# Patient Record
Sex: Female | Born: 1955 | Race: White | Hispanic: No | Marital: Married | Smoking: Never smoker
Health system: Southern US, Community
[De-identification: ages and names within clinical notes are randomized; demographics above are authoritative.]

## PROBLEM LIST (undated history)

## (undated) DIAGNOSIS — J302 Other seasonal allergic rhinitis: Secondary | ICD-10-CM

## (undated) DIAGNOSIS — T8859XA Other complications of anesthesia, initial encounter: Secondary | ICD-10-CM

## (undated) DIAGNOSIS — C801 Malignant (primary) neoplasm, unspecified: Secondary | ICD-10-CM

## (undated) DIAGNOSIS — T4145XA Adverse effect of unspecified anesthetic, initial encounter: Secondary | ICD-10-CM

## (undated) DIAGNOSIS — K219 Gastro-esophageal reflux disease without esophagitis: Secondary | ICD-10-CM

## (undated) DIAGNOSIS — R112 Nausea with vomiting, unspecified: Secondary | ICD-10-CM

## (undated) DIAGNOSIS — E041 Nontoxic single thyroid nodule: Secondary | ICD-10-CM

## (undated) DIAGNOSIS — R011 Cardiac murmur, unspecified: Secondary | ICD-10-CM

## (undated) DIAGNOSIS — Z9889 Other specified postprocedural states: Secondary | ICD-10-CM

## (undated) DIAGNOSIS — N6310 Unspecified lump in the right breast, unspecified quadrant: Secondary | ICD-10-CM

## (undated) DIAGNOSIS — I498 Other specified cardiac arrhythmias: Secondary | ICD-10-CM

## (undated) DIAGNOSIS — Z8614 Personal history of Methicillin resistant Staphylococcus aureus infection: Secondary | ICD-10-CM

## (undated) DIAGNOSIS — M1990 Unspecified osteoarthritis, unspecified site: Secondary | ICD-10-CM

## (undated) HISTORY — DX: Unspecified lump in the right breast, unspecified quadrant: N63.10

## (undated) HISTORY — PX: TONSILLECTOMY: SUR1361

## (undated) HISTORY — PX: EYE SURGERY: SHX253

## (undated) HISTORY — DX: Other seasonal allergic rhinitis: J30.2

---

## 2001-01-31 HISTORY — PX: BREAST CYST ASPIRATION: SHX578

## 2004-04-20 ENCOUNTER — Ambulatory Visit: Payer: Self-pay | Admitting: General Practice

## 2008-05-26 ENCOUNTER — Ambulatory Visit: Payer: Self-pay | Admitting: Internal Medicine

## 2012-09-24 ENCOUNTER — Ambulatory Visit: Payer: Self-pay | Admitting: Family Medicine

## 2013-01-31 HISTORY — PX: COLONOSCOPY: SHX174

## 2013-03-21 ENCOUNTER — Ambulatory Visit: Payer: Self-pay | Admitting: Family Medicine

## 2013-05-27 ENCOUNTER — Ambulatory Visit: Payer: Self-pay | Admitting: Gastroenterology

## 2013-05-28 LAB — PATHOLOGY REPORT

## 2015-06-04 ENCOUNTER — Emergency Department: Payer: BLUE CROSS/BLUE SHIELD

## 2015-06-04 ENCOUNTER — Encounter: Payer: Self-pay | Admitting: Emergency Medicine

## 2015-06-04 ENCOUNTER — Emergency Department
Admission: EM | Admit: 2015-06-04 | Discharge: 2015-06-04 | Disposition: A | Discharge status: Home / Self Care | Payer: BLUE CROSS/BLUE SHIELD | Attending: Emergency Medicine | Admitting: Emergency Medicine

## 2015-06-04 DIAGNOSIS — S93502A Unspecified sprain of left great toe, initial encounter: Secondary | ICD-10-CM | POA: Diagnosis not present

## 2015-06-04 DIAGNOSIS — Y92410 Unspecified street and highway as the place of occurrence of the external cause: Secondary | ICD-10-CM | POA: Diagnosis not present

## 2015-06-04 DIAGNOSIS — S5011XA Contusion of right forearm, initial encounter: Secondary | ICD-10-CM | POA: Insufficient documentation

## 2015-06-04 DIAGNOSIS — Y999 Unspecified external cause status: Secondary | ICD-10-CM | POA: Diagnosis not present

## 2015-06-04 DIAGNOSIS — S20212A Contusion of left front wall of thorax, initial encounter: Secondary | ICD-10-CM | POA: Insufficient documentation

## 2015-06-04 DIAGNOSIS — S93509A Unspecified sprain of unspecified toe(s), initial encounter: Secondary | ICD-10-CM

## 2015-06-04 DIAGNOSIS — S20219A Contusion of unspecified front wall of thorax, initial encounter: Secondary | ICD-10-CM

## 2015-06-04 DIAGNOSIS — Y939 Activity, unspecified: Secondary | ICD-10-CM | POA: Diagnosis not present

## 2015-06-04 DIAGNOSIS — S299XXA Unspecified injury of thorax, initial encounter: Secondary | ICD-10-CM | POA: Diagnosis present

## 2015-06-04 LAB — COMPREHENSIVE METABOLIC PANEL
ALK PHOS: 49 U/L (ref 38–126)
ALT: 18 U/L (ref 14–54)
ANION GAP: 13 (ref 5–15)
AST: 30 U/L (ref 15–41)
Albumin: 5 g/dL (ref 3.5–5.0)
BILIRUBIN TOTAL: 2.3 mg/dL — AB (ref 0.3–1.2)
BUN: 18 mg/dL (ref 6–20)
CALCIUM: 10.2 mg/dL (ref 8.9–10.3)
CO2: 25 mmol/L (ref 22–32)
CREATININE: 0.93 mg/dL (ref 0.44–1.00)
Chloride: 102 mmol/L (ref 101–111)
GFR calc non Af Amer: >60 mL/min (ref >60)
Glucose, Bld: 105 mg/dL — ABNORMAL HIGH (ref 65–99)
Potassium: 3.4 mmol/L — ABNORMAL LOW (ref 3.5–5.1)
Sodium: 140 mmol/L (ref 135–145)
TOTAL PROTEIN: 7.7 g/dL (ref 6.5–8.1)

## 2015-06-04 LAB — CBC WITH DIFFERENTIAL/PLATELET
Basophils Absolute: 0 10*3/uL (ref 0–0.1)
Basophils Relative: 0 %
Eosinophils Absolute: 0.1 10*3/uL (ref 0–0.7)
Eosinophils Relative: 1 %
HEMATOCRIT: 54.5 % — AB (ref 35.0–47.0)
HEMOGLOBIN: 18.4 g/dL — AB (ref 12.0–16.0)
LYMPHS ABS: 1.9 10*3/uL (ref 1.0–3.6)
Lymphocytes Relative: 33 %
MCH: 33 pg (ref 26.0–34.0)
MCHC: 33.9 g/dL (ref 32.0–36.0)
MCV: 97.4 fL (ref 80.0–100.0)
MONOS PCT: 7 %
Monocytes Absolute: 0.4 10*3/uL (ref 0.2–0.9)
NEUTROS ABS: 3.4 10*3/uL (ref 1.4–6.5)
NEUTROS PCT: 59 %
Platelets: 196 10*3/uL (ref 150–440)
RBC: 5.59 MIL/uL — ABNORMAL HIGH (ref 3.80–5.20)
RDW: 15.6 % — ABNORMAL HIGH (ref 11.5–14.5)
WBC: 5.9 10*3/uL (ref 3.6–11.0)

## 2015-06-04 MED ORDER — IBUPROFEN 600 MG PO TABS
600.0000 mg | ORAL_TABLET | Freq: Once | ORAL | Status: AC
  Administered 2015-06-04: 600 mg via ORAL
  Filled 2015-06-04: qty 1

## 2015-06-04 MED ORDER — IOPAMIDOL (ISOVUE-300) INJECTION 61%
100.0000 mL | Freq: Once | INTRAVENOUS | Status: AC | PRN
  Administered 2015-06-04: 100 mL via INTRAVENOUS

## 2015-06-04 MED ORDER — DIAZEPAM 5 MG/ML IJ SOLN
5.0000 mg | Freq: Once | INTRAMUSCULAR | Status: AC
  Administered 2015-06-04: 5 mg via INTRAVENOUS
  Filled 2015-06-04: qty 2

## 2015-06-04 MED ORDER — TRAMADOL HCL 50 MG PO TABS: 50.0000 mg | ORAL_TABLET | Freq: Four times a day (QID) | ORAL | Status: AC | PRN

## 2015-06-04 MED ORDER — SODIUM CHLORIDE 0.9 % IV SOLN
Freq: Once | INTRAVENOUS | Status: AC
  Administered 2015-06-04: 1000 mL via INTRAVENOUS

## 2015-06-04 NOTE — ED Notes (Signed)
Patient transported to CT 

## 2015-06-04 NOTE — Discharge Instructions (Signed)

## 2015-06-04 NOTE — ED Provider Notes (Signed)
-----------------------------------------   5:01 PM on 06/04/2015 -----------------------------------------  Patient CT scan show a thyroid mass otherwise within normal limits. Patient does have a distal tuft fracture of her great toe. I discussed with the patient wearing a hard sole shoe and follow up with Peter aches if the toe does not improve over the next 2 weeks. 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 We will discharge the patient home on Ultram as needed.  Harvest Dark, MD 06/04/15 [phone]
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
## 2015-06-05 NOTE — ED Notes (Signed)
Forgot to waste Valium during shift; returned today to waste 5mg  Valium.  Waste recorded in pyxis with RN, [employer].

## 2016-07-05 DIAGNOSIS — H5340 Unspecified visual field defects: Secondary | ICD-10-CM | POA: Insufficient documentation

## 2016-07-05 DIAGNOSIS — H02836 Dermatochalasis of left eye, unspecified eyelid: Secondary | ICD-10-CM

## 2016-07-05 DIAGNOSIS — H02833 Dermatochalasis of right eye, unspecified eyelid: Secondary | ICD-10-CM | POA: Insufficient documentation

## 2016-07-05 DIAGNOSIS — H02403 Unspecified ptosis of bilateral eyelids: Secondary | ICD-10-CM | POA: Insufficient documentation

## 2017-10-31 ENCOUNTER — Other Ambulatory Visit: Payer: Self-pay | Admitting: Family Medicine

## 2017-10-31 DIAGNOSIS — Z1231 Encounter for screening mammogram for malignant neoplasm of breast: Secondary | ICD-10-CM

## 2017-11-13 ENCOUNTER — Ambulatory Visit
Admission: RE | Admit: 2017-11-13 | Discharge: 2017-11-13 | Disposition: A | Discharge status: Home / Self Care | Payer: BLUE CROSS/BLUE SHIELD | Source: Ambulatory Visit | Attending: Family Medicine | Admitting: Family Medicine

## 2017-11-13 DIAGNOSIS — Z1231 Encounter for screening mammogram for malignant neoplasm of breast: Secondary | ICD-10-CM | POA: Insufficient documentation

## 2017-11-16 ENCOUNTER — Other Ambulatory Visit: Payer: Self-pay | Admitting: Family Medicine

## 2017-11-16 DIAGNOSIS — R928 Other abnormal and inconclusive findings on diagnostic imaging of breast: Secondary | ICD-10-CM

## 2017-11-29 ENCOUNTER — Ambulatory Visit
Admission: RE | Admit: 2017-11-29 | Discharge: 2017-11-29 | Disposition: A | Discharge status: Home / Self Care | Payer: BLUE CROSS/BLUE SHIELD | Source: Ambulatory Visit | Attending: Family Medicine | Admitting: Family Medicine

## 2017-11-29 DIAGNOSIS — R928 Other abnormal and inconclusive findings on diagnostic imaging of breast: Secondary | ICD-10-CM

## 2017-12-01 ENCOUNTER — Other Ambulatory Visit: Payer: Self-pay | Admitting: Family Medicine

## 2017-12-01 DIAGNOSIS — R928 Other abnormal and inconclusive findings on diagnostic imaging of breast: Secondary | ICD-10-CM

## 2017-12-01 DIAGNOSIS — R921 Mammographic calcification found on diagnostic imaging of breast: Secondary | ICD-10-CM

## 2017-12-11 ENCOUNTER — Ambulatory Visit: Payer: BLUE CROSS/BLUE SHIELD

## 2017-12-20 ENCOUNTER — Ambulatory Visit
Admission: RE | Admit: 2017-12-20 | Discharge: 2017-12-20 | Disposition: A | Discharge status: Home / Self Care | Payer: BLUE CROSS/BLUE SHIELD | Source: Ambulatory Visit | Attending: Family Medicine | Admitting: Family Medicine

## 2017-12-20 DIAGNOSIS — N6310 Unspecified lump in the right breast, unspecified quadrant: Secondary | ICD-10-CM

## 2017-12-20 DIAGNOSIS — R928 Other abnormal and inconclusive findings on diagnostic imaging of breast: Secondary | ICD-10-CM | POA: Diagnosis present

## 2017-12-20 DIAGNOSIS — R921 Mammographic calcification found on diagnostic imaging of breast: Secondary | ICD-10-CM

## 2017-12-20 HISTORY — PX: BREAST BIOPSY: SHX20

## 2017-12-20 HISTORY — DX: Unspecified lump in the right breast, unspecified quadrant: N63.10

## 2017-12-21 ENCOUNTER — Other Ambulatory Visit: Payer: Self-pay | Admitting: Anatomic Pathology & Clinical Pathology

## 2017-12-21 LAB — SURGICAL PATHOLOGY

## 2018-01-02 ENCOUNTER — Other Ambulatory Visit: Payer: Self-pay

## 2018-01-02 DIAGNOSIS — D0501 Lobular carcinoma in situ of right breast: Secondary | ICD-10-CM

## 2018-01-02 NOTE — Progress Notes (Signed)
  Oncology Nurse Navigator Documentation  Navigator Location: [facility] (01/02/18 1100)   )Navigator Encounter Type: Introductory phone call (01/02/18 1100)   Abnormal Finding Date: 11/29/17 (01/02/18 1100) Confirmed Diagnosis Date: 12/20/17 (01/02/18 1100)               Patient Visit Type: Initial (01/02/18 1100)   Barriers/Navigation Needs: Coordination of Care;Education (01/02/18 1100) Education: Accessing Care/ Finding Providers;Coping with Diagnosis/ Prognosis;Newly Diagnosed Cancer Education (01/02/18 1100) Interventions: Coordination of Care;Education (01/02/18 1100)                      Time Spent with Patient: 90 (01/02/18 1100)   Phoned patient 12/26/17 initially to introduce navigation service.  Discussed case with Dr. Grayland Ormond, and Dr. Dicie Beam post case conference on 12/25/17.  Oncology referral made.  Consult scheduled with Dr. Grayland Ormond on 01/05/18 in [place] at 2:30.

## 2018-01-05 ENCOUNTER — Inpatient Hospital Stay: Payer: BLUE CROSS/BLUE SHIELD | Attending: Oncology | Admitting: Oncology

## 2018-01-05 ENCOUNTER — Other Ambulatory Visit: Payer: Self-pay

## 2018-01-05 ENCOUNTER — Encounter: Payer: Self-pay | Admitting: Oncology

## 2018-01-05 DIAGNOSIS — D0501 Lobular carcinoma in situ of right breast: Secondary | ICD-10-CM | POA: Insufficient documentation

## 2018-01-05 DIAGNOSIS — Z803 Family history of malignant neoplasm of breast: Secondary | ICD-10-CM | POA: Diagnosis not present

## 2018-01-05 NOTE — Progress Notes (Signed)
Patient is here today for right breast cancer. Patient stated that she had not noticed any skin discoloration or nipple discharge. Patient stated that she does have some pain on her right breast due to the biopsy that was done. Patient stated that she was over due for her mammogram and that's how things were found. Patient stated that she would try to do her monthly self breast exams.

## 2018-01-07 DIAGNOSIS — D0501 Lobular carcinoma in situ of right breast: Secondary | ICD-10-CM | POA: Insufficient documentation

## 2018-01-07 NOTE — Progress Notes (Signed)
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  INTERVAL HISTORY: Patient is a 62 year old female who was noted to have an abnormality on routine yearly screening mammogram.  Subsequent biopsy revealed noninvasive pleomorphic LCIS.  She currently feels well and is asymptomatic.  She has no neurologic planes.  Patient denies any recent fevers or illnesses.  She has a good appetite and denies weight loss.  She has no chest pain or shortness of breath.  She denies any nausea, vomiting, constipation, or diarrhea.  She has no urinary complaints.  Patient feels at her baseline offers no specific complaints today.  REVIEW OF SYSTEMS:   Review of Systems  Constitutional: Negative.  Negative for fever, malaise/fatigue and weight loss.  Respiratory: Negative.  Negative for cough, hemoptysis and shortness of breath.   Cardiovascular: Negative.  Negative for chest pain and leg swelling.  Gastrointestinal: Negative.  Negative for abdominal pain, blood in stool and melena.  Genitourinary: Negative.  Negative for dysuria.  Musculoskeletal: Negative.  Negative for back pain.  Skin: Negative.  Negative for rash.  Neurological: Negative.  Negative for focal weakness, weakness and headaches.  Psychiatric/Behavioral: Negative.  The patient is not nervous/anxious.     As per HPI. Otherwise, a complete review of systems is negative.  PAST MEDICAL HISTORY: Past Medical History:  Diagnosis Date  . Seasonal allergies     PAST SURGICAL HISTORY: Past Surgical History:  Procedure Laterality Date  . BREAST BIOPSY Right 12/20/2017   stereo bx calcs/x clip/ path pending  . BREAST CYST ASPIRATION Bilateral 2003   neg  . TONSILLECTOMY      FAMILY HISTORY: Family History  Problem  Relation Age of Onset  . Hypertension Mother   . Heart disease Father   . Breast cancer Sister 71       DCIS Right Breast    ADVANCED DIRECTIVES (Y/N):  N  HEALTH MAINTENANCE: Social History   Tobacco Use  . Smoking status: Never Smoker  Substance Use Topics  . Alcohol use: Yes    Comment: socially  . Drug use: No     Colonoscopy:  PAP:  Bone density:  Lipid panel:  No Known Allergies  Current Outpatient Medications  Medication Sig Dispense Refill  . fluticasone (FLONASE) 50 MCG/ACT nasal spray Place into the nose.    . triamcinolone cream (KENALOG) 0.1 % APPLY TO AFFECTED AREA TWICE A DAY  1   No current facility-administered medications for this visit.     OBJECTIVE: Vitals:   01/05/18 1429  BP: (!) 152/87  Pulse: 74  Temp: (!) 97.3 F (36.3 C)     Body mass index is 19.41 kg/m.    ECOG FS:0 - Asymptomatic  General: Well-developed, well-nourished, no acute distress. Eyes: Pink conjunctiva, anicteric sclera. HEENT: Normocephalic, moist mucous membranes, clear oropharnyx. Breast: No palpable lumps or masses. Lungs: Clear to auscultation bilaterally. Heart: Regular rate and rhythm. No rubs, murmurs, or gallops. Abdomen: Soft, nontender, nondistended. No organomegaly noted, normoactive bowel sounds. Musculoskeletal: No edema, cyanosis, or clubbing. Neuro: Alert, answering all questions appropriately. Cranial nerves grossly intact. Skin: No rashes or petechiae noted. Psych: Normal affect. Lymphatics: No cervical, calvicular, axillary or inguinal LAD.   LAB RESULTS:  Lab Results  Component Value Date   NA 140 06/04/2015   K 3.4 (L)  06/04/2015   CL 102 06/04/2015   CO2 25 06/04/2015   GLUCOSE 105 (H) 06/04/2015   BUN 18 06/04/2015   CREATININE 0.93 06/04/2015   CALCIUM 10.2 06/04/2015   PROT 7.7 06/04/2015   ALBUMIN 5.0 06/04/2015   AST 30 06/04/2015   ALT 18 06/04/2015   ALKPHOS 49 06/04/2015   BILITOT 2.3 (H) 06/04/2015   GFRNONAA >60  06/04/2015   GFRAA >60 06/04/2015    Lab Results  Component Value Date   WBC 5.9 06/04/2015   NEUTROABS 3.4 06/04/2015   HGB 18.4 (H) 06/04/2015   HCT 54.5 (H) 06/04/2015   MCV 97.4 06/04/2015   PLT 196 06/04/2015     STUDIES: Mm Clip Placement Right  Result Date: 12/20/2017 CLINICAL DATA:  Post biopsy mammogram of the right breast for clip placement. EXAM: DIAGNOSTIC RIGHT MAMMOGRAM POST STEREOTACTIC BIOPSY COMPARISON:  Previous exam(s). FINDINGS: Mammographic images were obtained following stereotactic guided biopsy of calcifications in the upper inner right breast. The X shaped biopsy marking clip is well positioned at the site of biopsy in upper inner right breast. IMPRESSION: Appropriate positioning of the X shaped biopsy marking clip in the upper inner right breast. Final Assessment: Post Procedure Mammograms for Marker Placement Electronically Signed   By: Ammie Ferrier M.D.   On: 12/20/2017 13:31   Mm Rt Breast Bx W Loc Dev 1st Lesion Image Bx Spec Stereo Guide  Result Date: 12/20/2017 CLINICAL DATA:  62 year old female presenting for stereotactic biopsy of right breast calcifications. EXAM: RIGHT BREAST STEREOTACTIC CORE NEEDLE BIOPSY COMPARISON:  Previous exams. FINDINGS: The patient and I discussed the procedure of stereotactic-guided biopsy including benefits and alternatives. We discussed the high likelihood of a successful procedure. We discussed the risks of the procedure including infection, bleeding, tissue injury, clip migration, and inadequate sampling. Informed written consent was given. The usual time out protocol was performed immediately prior to the procedure. Using sterile technique and 1% Lidocaine as local anesthetic, under stereotactic guidance, a 9 gauge vacuum assisted device was used to perform core needle biopsy of calcifications in the upper inner quadrant of the right breast using a superior approach. Specimen radiograph was performed showing  calcifications primarily within 2 core samples. Specimens with calcifications are identified for pathology. Lesion quadrant: Upper inner quadrant At the conclusion of the procedure, a X shaped tissue marker clip was deployed into the biopsy cavity. Follow-up 2-view mammogram was performed and dictated separately. IMPRESSION: Stereotactic-guided biopsy of calcifications in the upper inner quadrant of the right breast. No apparent complications. Electronically Signed   By: Ammie Ferrier M.D.   On: 12/20/2017 13:24    ASSESSMENT: Pleomorphic LCIS of right breast.  PLAN:    1. Pleomorphic LCIS of right breast: Imaging reviewed independently.  Case discussed with pathology.  Typically pleomorphic LCIS is treated like DCIS and will require lumpectomy plus XRT followed by 5 years of tamoxifen.  Will discuss patient at breast tumor board on Monday, January 08, 2018 for final recommendations.  No follow-up is been scheduled at this time.  Will schedule follow-up after her lumpectomy to discuss her final pathology results.  I spent a total of 60 minutes face-to-face with the patient of which greater than 50% of the visit was spent in counseling and coordination of care as detailed above.   Patient expressed understanding and was in agreement with this plan. She also understands that She can call clinic at any time with any questions, concerns, or complaints.   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

## 2018-01-09 NOTE — Progress Notes (Signed)
Patient scheduled for surgical consult with Dr. Hampton Abbot on 01/10/18 at 10:30.  Patient has been notified of appointment.  Discussed in Breast Case Conference on 01/08/18.  Recommendation to treat as you would DCIS with lumpectomy and radiation.

## 2018-01-10 ENCOUNTER — Encounter: Payer: Self-pay | Admitting: Surgery

## 2018-01-10 ENCOUNTER — Ambulatory Visit (INDEPENDENT_AMBULATORY_CARE_PROVIDER_SITE_OTHER): Payer: BLUE CROSS/BLUE SHIELD | Admitting: Surgery

## 2018-01-10 ENCOUNTER — Encounter: Payer: Self-pay | Admitting: *Deleted

## 2018-01-10 ENCOUNTER — Other Ambulatory Visit: Payer: Self-pay

## 2018-01-10 VITALS — BP 157/95 | HR 90 | Temp 97.9°F | Resp 16 | Ht 63.0 in | Wt 109.0 lb

## 2018-01-10 DIAGNOSIS — D0501 Lobular carcinoma in situ of right breast: Secondary | ICD-10-CM | POA: Diagnosis not present

## 2018-01-10 NOTE — Progress Notes (Signed)
01/10/2018  Reason for Visit:  Right breast pleomorphic LCIS  Referring Provider:  Delight Hoh, MD  History of Present Illness: Sarah Hurley is a 62 y.o. female presenting with new diagnosis of right breast pleomorphic LCIS.  She has been seen by Dr. Grayland Ormond who is recommending excision.  She was discussed at tumor board on 12/9 and recommendation was made for excision due to the pleomorphic nature which is more aggressive rather than classic LCIS.  The patient had her screening mammogram on 10/14 and was found to have some calcifications in the right breast.  This was followed up with diagnostic right mammogram on 10/30 which led to biopsy on 11/20.  She otherwise had not been able to palpate any lumps or bumps on either breast.  She denies having any nipple drainage, any skin color changes or retraction, or lumps in the axilla.  She has a sister who had DCIS.  Past Medical History: Past Medical History:  Diagnosis Date  . Seasonal allergies      Past Surgical History: Past Surgical History:  Procedure Laterality Date  . BREAST BIOPSY Right 12/20/2017   stereo bx calcs/x clip/ PLEOMORPHIC LOBULAR CARCINOMA IN SITU  . BREAST CYST ASPIRATION Bilateral 2003   neg  . COLONOSCOPY  2015  . TONSILLECTOMY      Home Medications: Prior to Admission medications   Medication Sig Start Date End Date Taking? Authorizing Provider  fluticasone (FLONASE) 50 MCG/ACT nasal spray Place into the nose as needed.    Yes [provider]  triamcinolone cream (KENALOG) 0.1 % as needed.  12/20/17  Yes [provider]    Allergies: Allergies  Allergen Reactions  . Penicillins Rash    Social History:  reports that she has never smoked. She has never used smokeless tobacco. She reports that she drinks alcohol. She reports that she does not use drugs.   Family History: Family History  Problem Relation Age of Onset  . Hypertension Mother   . Heart disease Father   .  Breast cancer Sister 96       DCIS Right Breast    Review of Systems: Review of Systems  Constitutional: Negative for chills and fever.  HENT: Negative for hearing loss.   Eyes: Negative for blurred vision.  Respiratory: Negative for shortness of breath.   Cardiovascular: Negative for chest pain.  Gastrointestinal: Negative for abdominal pain, nausea and vomiting.  Genitourinary: Negative for dysuria.  Musculoskeletal: Negative for myalgias.  Skin: Negative for rash.  Neurological: Negative for dizziness.  Psychiatric/Behavioral: Negative for depression.    Physical Exam BP (!) 157/95   Pulse 90   Temp 97.9 F (36.6 C) (Skin)   Resp 16   Ht 5\' 3"  (1.6 m)   Wt 109 lb (49.4 kg)   SpO2 98%   BMI 19.31 kg/m  CONSTITUTIONAL: No acute distress HEENT:  Normocephalic, atraumatic, extraocular motion intact. NECK: Trachea is midline, and there is no jugular venous distension.  RESPIRATORY:  Lungs are clear, and breath sounds are equal bilaterally. Normal respiratory effort without pathologic use of accessory muscles. CARDIOVASCULAR: Heart is regular without murmurs, gallops, or rubs. BREAST:  Right breast with upper mid breast biopsy site healing well, with minimal ecchymosis inferior to it.  Otherwise no palpable masses, no nipple drainage, and no lymphadenopathy.  Negative exam on left breast and axilla. GI: The abdomen is soft, nondistended, nontender.  MUSCULOSKELETAL:  Normal muscle strength and tone in all four extremities.  No peripheral edema or  cyanosis. SKIN: Skin turgor is normal. There are no pathologic skin lesions.  NEUROLOGIC:  Motor and sensation is grossly normal.  Cranial nerves are grossly intact. PSYCH:  Alert and oriented to person, place and time. Affect is normal.  Laboratory Analysis: Pathology 11/20: DIAGNOSIS:  A. BREAST, RIGHT; STEREOTACTIC CORE BIOPSY:  - PLEOMORPHIC LOBULAR CARCINOMA IN SITU, WITH NECROSIS AND ASSOCIATED CALCIFICATIONS, MEASURING AT  LEAST 4.5 MM IN GREATEST EXTENT, INVOLVING 3 OF MULTIPLE TISSUE CORES.  - NO EVIDENCE OF INVASIVE CARCINOMA.   Comment:  The final diagnosis and histologic grade will be dependent upon examination of the final resection. Immunohistochemical testing for ER and PR will be deferred to the final resection, but may be ordered if  clinically indicated.   Imaging: Mammogram 10/30: FINDINGS: There is a group of calcifications in the superior central right breast which are pleomorphic in span 3 mm.  Mammographic images were processed with CAD.  IMPRESSION: Indeterminate right breast calcifications.   Assessment and Plan: This is a 62 y.o. female with new diagnosis of right breast pleomorphic LCIS.  Discussed with the patient that in her case, given pleomorphic LCIS, I would agree with recommendation for excision and treat it as if it were DCIS.  This would involve a right breast wire localized lumpectomy, followed by adjuvant radiation therapy.  Discussed with her the role of wire localization and post-op recovery and restrictions.  The patient is surprised still about her diagnosis, as initially she thought with LCIS her risk was low and may not need resection.  She understands the reasoning behind the new recommendation and she has done her research as well and found the same recommendation.    She and her husband would like to think about her options for surgery and she will contact us with her decision.  Did discuss the possibility of doing surgery as early as next week, and she and her husband would like to wait until after Christmas.  This is reasonable as there is no true cancer diagnosis.  At the same time, also discussed with the patient that I would be happy to provide her with a referral if she wanted to seek a 2nd opinion, whether it would be with [facility] or with [facility]. She understands this and will call us with her decision.  Face-to-face time spent with the patient and care providers was 60  minutes, with more than 50% of the time spent counseling, educating, and coordinating care of the patient.     Melvyn Neth, [facility]

## 2018-01-10 NOTE — Progress Notes (Signed)
Patient to call the office with decision about surgery scheduling or if she desires a second opinion.

## 2018-01-10 NOTE — Patient Instructions (Addendum)
The patient is aware to call back for any questions or new concerns. "[email or web address]" or [facility] is recommended research sites

## 2018-01-26 NOTE — Progress Notes (Signed)
[place]  [phone] [phone]  ID: Sarah Hurley OB: 1956/01/14  MR#: [id number]  XTG#:[id number]  Patient Care Team: Maryland Pink, MD as PCP - General (Family Medicine)  CHIEF COMPLAINT: Pleomorphic LCIS of right breast.  INTERVAL HISTORY: Patient returns to clinic today for further discussion of her diagnosis.  Since her last clinic visit she has been evaluated by surgery, but has not pursued any treatments.  She continues to feel well and is asymptomatic.  She has no neurologic complaints.  Patient denies any recent fevers or illnesses.  She has a good appetite and denies weight loss.  She has no chest pain or shortness of breath.  She denies any nausea, vomiting, constipation, or diarrhea.  She has no urinary complaints.  Patient offers no specific complaints today.  REVIEW OF SYSTEMS:   Review of Systems  Constitutional: Negative.  Negative for fever, malaise/fatigue and weight loss.  Respiratory: Negative.  Negative for cough, hemoptysis and shortness of breath.   Cardiovascular: Negative.  Negative for chest pain and leg swelling.  Gastrointestinal: Negative.  Negative for abdominal pain, blood in stool and melena.  Genitourinary: Negative.  Negative for dysuria.  Musculoskeletal: Negative.  Negative for back pain.  Skin: Negative.  Negative for rash.  Neurological: Negative.  Negative for focal weakness, weakness and headaches.  Psychiatric/Behavioral: Negative.  The patient is not nervous/anxious.     As per HPI. Otherwise, a complete review of systems is negative.  PAST MEDICAL HISTORY: Past Medical History:  Diagnosis Date  . Breast mass, right 12/20/2017   PLEOMORPHIC LOBULAR CARCINOMA IN SITU  . Seasonal allergies     PAST SURGICAL HISTORY: Past Surgical History:  Procedure Laterality Date  . BREAST BIOPSY Right 12/20/2017   stereo bx calcs/x clip/ PLEOMORPHIC LOBULAR CARCINOMA IN SITU  . BREAST CYST ASPIRATION  Bilateral 2003   neg  . COLONOSCOPY  2015  . TONSILLECTOMY      FAMILY HISTORY: Family History  Problem Relation Age of Onset  . Hypertension Mother   . Heart disease Father   . Breast cancer Sister 90       DCIS Right Breast    ADVANCED DIRECTIVES (Y/N):  N  HEALTH MAINTENANCE: Social History   Tobacco Use  . Smoking status: Never Smoker  . Smokeless tobacco: Never Used  Substance Use Topics  . Alcohol use: Yes    Comment: socially  . Drug use: No     Colonoscopy:  PAP:  Bone density:  Lipid panel:  Allergies  Allergen Reactions  . Penicillins Rash    Current Outpatient Medications  Medication Sig Dispense Refill  . fluticasone (FLONASE) 50 MCG/ACT nasal spray Place into the nose as needed.     . triamcinolone cream (KENALOG) 0.1 % as needed.   1   No current facility-administered medications for this visit.     OBJECTIVE: Vitals:   02/01/18 1048  BP: (!) 158/93  Pulse: 84  Temp: (!) 97.5 F (36.4 C)     Body mass index is 19.13 kg/m.    ECOG FS:0 - Asymptomatic  General: Well-developed, well-nourished, no acute distress. Eyes: Pink conjunctiva, anicteric sclera. HEENT: Normocephalic, moist mucous membranes. Breast: Exam deferred today. Lungs: Clear to auscultation bilaterally. Heart: Regular rate and rhythm. No rubs, murmurs, or gallops. Abdomen: Soft, nontender, nondistended. No organomegaly noted, normoactive bowel sounds. Musculoskeletal: No edema, cyanosis, or clubbing. Neuro: Alert, answering all questions appropriately. Cranial nerves grossly intact. Skin: No rashes or petechiae noted. Psych:  Normal affect.  LAB RESULTS:  Lab Results  Component Value Date   NA 140 06/04/2015   K 3.4 (L) 06/04/2015   CL 102 06/04/2015   CO2 25 06/04/2015   GLUCOSE 105 (H) 06/04/2015   BUN 18 06/04/2015   CREATININE 0.93 06/04/2015   CALCIUM 10.2 06/04/2015   PROT 7.7 06/04/2015   ALBUMIN 5.0 06/04/2015   AST 30 06/04/2015   ALT 18 06/04/2015     ALKPHOS 49 06/04/2015   BILITOT 2.3 (H) 06/04/2015   GFRNONAA >60 06/04/2015   GFRAA >60 06/04/2015    Lab Results  Component Value Date   WBC 5.9 06/04/2015   NEUTROABS 3.4 06/04/2015   HGB 18.4 (H) 06/04/2015   HCT 54.5 (H) 06/04/2015   MCV 97.4 06/04/2015   PLT 196 06/04/2015     STUDIES: No results found.  ASSESSMENT: Pleomorphic LCIS of right breast.  PLAN:    1. Pleomorphic LCIS of right breast: Case has been discussed with surgery and pathology.  Typically pleomorphic LCIS is treated like DCIS and requires lumpectomy plus XRT followed by 5 years of tamoxifen.  Patient had many questions regarding what would happen if she "did nothing".  Although noninvasive, patient expressed understanding that if she did not pursue any treatment there is a high likelihood that this lesion would eventually progress to invasive malignancy.  She is unclear if she wishes to pursue surgery or treatment with tamoxifen at this point.  Patient expressed understanding of doing nothing is AGAINST MEDICAL ADVICE.  No follow-up has been scheduled.  She has been instructed to call the surgical office when she makes a decision.  If patient elects to do nothing, she did agree to follow-up in 6 months with repeat mammogram.   I spent a total of 30 minutes face-to-face with the patient of which greater than 50% of the visit was spent in counseling and coordination of care as detailed above.   Patient expressed understanding and was in agreement with this plan. She also understands that She can call clinic at any time with any questions, concerns, or complaints.   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

## 2018-02-01 ENCOUNTER — Other Ambulatory Visit: Payer: Self-pay

## 2018-02-01 ENCOUNTER — Inpatient Hospital Stay: Payer: BLUE CROSS/BLUE SHIELD | Attending: Oncology | Admitting: Oncology

## 2018-02-01 VITALS — BP 158/93 | HR 84 | Temp 97.5°F | Ht 63.0 in | Wt 108.0 lb

## 2018-02-01 DIAGNOSIS — Z8249 Family history of ischemic heart disease and other diseases of the circulatory system: Secondary | ICD-10-CM | POA: Diagnosis not present

## 2018-02-01 DIAGNOSIS — D0501 Lobular carcinoma in situ of right breast: Secondary | ICD-10-CM | POA: Insufficient documentation

## 2018-02-01 NOTE — Progress Notes (Signed)
Patient is here today to follow up on her LCIS of right breast. Patient stated that she had been doing well. Patient denied fever, chills, nausea, vomiting, constipation or diarrhea.

## 2018-02-07 NOTE — Progress Notes (Signed)
  Oncology Nurse Navigator Documentation  Navigator Location: [facility] (02/07/18 0900)   )Navigator Encounter Type: Telephone (02/07/18 0900) Telephone: Incoming Call;Appt Confirmation/Clarification (02/07/18 0900)                   Patient Visit Type: Follow-up (02/07/18 0900)   Barriers/Navigation Needs: Coordination of Care (02/07/18 0900)                          Time Spent with Patient: 30 (02/07/18 0900)   Patient requested second opinion with Dr. Bary Castilla.  Appointment scheduled for 03/06/2018 at 4:45.  Notified patient of appointment information.

## 2018-03-06 ENCOUNTER — Other Ambulatory Visit: Payer: Self-pay

## 2018-03-06 ENCOUNTER — Ambulatory Visit (INDEPENDENT_AMBULATORY_CARE_PROVIDER_SITE_OTHER): Payer: BLUE CROSS/BLUE SHIELD | Admitting: General Surgery

## 2018-03-06 ENCOUNTER — Encounter: Payer: Self-pay | Admitting: General Surgery

## 2018-03-06 VITALS — BP 148/84 | HR 89 | Temp 97.9°F | Resp 16 | Ht 63.0 in | Wt 109.6 lb

## 2018-03-06 DIAGNOSIS — D0501 Lobular carcinoma in situ of right breast: Secondary | ICD-10-CM

## 2018-03-06 NOTE — Patient Instructions (Addendum)
The patient is aware to call back for any questions or new concerns.  

## 2018-03-06 NOTE — Progress Notes (Signed)
Patient ID: Sarah Hurley, female   DOB: 03-02-1955, 63 y.o.   MRN: [id number]  Chief Complaint  Patient presents with  . Breast Problem    HPI Sarah Hurley is a 63 y.o. female.  Here for second opinion regarding right breast pleomorphic lobular carcinoma. She is self employed working with computers.  Bra 32 B/C  HPI  Past Medical History:  Diagnosis Date  . Breast mass, right 12/20/2017   PLEOMORPHIC LOBULAR CARCINOMA IN SITU  . Seasonal allergies     Past Surgical History:  Procedure Laterality Date  . BREAST BIOPSY Right 12/20/2017   stereo bx calcs/x clip/ PLEOMORPHIC LOBULAR CARCINOMA IN SITU  . BREAST CYST ASPIRATION Bilateral 2003   neg  . COLONOSCOPY  2015  . TONSILLECTOMY      Family History  Problem Relation Age of Onset  . Hypertension Mother   . Heart disease Father   . Breast cancer Sister 63       DCIS Right Breast    Social History Social History   Tobacco Use  . Smoking status: Never Smoker  . Smokeless tobacco: Never Used  Substance Use Topics  . Alcohol use: Yes    Comment: socially  . Drug use: No    Allergies  Allergen Reactions  . Penicillins Rash    Did it involve swelling of the face/tongue/throat, SOB, or low BP? No Did it involve sudden or severe rash/hives, skin peeling, or any reaction on the inside of your mouth or nose? Yes Did you need to seek medical attention at a hospital or doctor's office? Unknown When did it last happen?childhood If all above answers are "NO", may proceed with cephalosporin use.     Current Outpatient Medications  Medication Sig Dispense Refill  . fluticasone (FLONASE) 50 MCG/ACT nasal spray Place 1-2 sprays into the nose 2 (two) times daily as needed for allergies.     Marland Kitchen triamcinolone cream (KENALOG) 0.1 % Apply 1 application topically 2 (two) times daily as needed (itchy skin).   1   No current facility-administered medications for this visit.     Review of Systems Review of Systems   Constitutional: Negative.   Respiratory: Negative.   Cardiovascular: Negative.     Blood pressure (!) 148/84, pulse 89, temperature 97.9 F (36.6 C), temperature source Temporal, resp. rate 16, height 5\' 3"  (1.6 m), weight 109 lb 9.6 oz (49.7 kg), SpO2 98 %.  Physical Exam Physical Exam Exam conducted with a chaperone present.  Constitutional:      Appearance: She is well-developed.  Eyes:     General: No scleral icterus.    Conjunctiva/sclera: Conjunctivae normal.  Neck:     Musculoskeletal: Neck supple.  Cardiovascular:     Rate and Rhythm: Normal rate and regular rhythm.     Heart sounds: Normal heart sounds.  Pulmonary:     Effort: Pulmonary effort is normal.     Breath sounds: Normal breath sounds.  Chest:     Breasts:        Right: No inverted nipple, mass, nipple discharge, skin change or tenderness.        Left: No inverted nipple, mass, nipple discharge, skin change or tenderness.  Lymphadenopathy:     Cervical: No cervical adenopathy.  Skin:    General: Skin is warm and dry.  Neurological:     Mental Status: She is alert and oriented to person, place, and time.  Psychiatric:        Behavior: Behavior  normal.     Data Reviewed December 20, 2017 stereotactic biopsy: A. BREAST, RIGHT; STEREOTACTIC CORE BIOPSY:  - PLEOMORPHIC LOBULAR CARCINOMA IN SITU, WITH NECROSIS AND ASSOCIATED  CALCIFICATIONS, MEASURING AT LEAST 4.5 MM IN GREATEST EXTENT, INVOLVING  3 OF MULTIPLE TISSUE CORES.  - NO EVIDENCE OF INVASIVE CARCINOMA.   Mammograms from November 13, 2017 through her biopsy in December 20, 2017 were reviewed.  New area of microcalcifications.  Moderately dense breast.  BI-RADS-4. Medical oncology notes of February 01, 2018 as well as Dr. Mont Dutton notes of January 10, 2018 reviewed.  Assessment    Pleomorphic LCIS (DCIS treatment protocol).    Plan    The patient had previously been informed that typical management of this is with wide excision followed by  radiation and hormone suppression of appropriate.  She is really not wildly interested about radiation therapy.  Reviewed the pros and cons of avoiding this, recognizing we except a higher local recurrence rate.  Possibility of mastectomy with reconstruction was discussed but does seem to be somewhat "overkill".  1 of her close friends is a patient of mine and she has expressed an interest in having me complete her surgery.  We reviewed all the options and at this time we will go ahead and arrange for a wire localization and wide excision at a convenient date as an outpatient.  Anticipate minimal time away from work.    We spent about 40 minutes going over all the ins and outs of the treatment regimens and protocols and various indications for and against proposed treatment modalities.  HPI and physical exam has been scribed under the direction and in the presence of Robert Bellow, MD. Karie Fetch, RN  I have completed the exam and reviewed the above documentation for accuracy and completeness.  I agree with the above.  Haematologist has been used and any errors in dictation or transcription are unintentional.  Hervey Ard, M.D., F.A.C.S.  Sarah Hurley 03/07/2018, 4:09 PM

## 2018-03-07 ENCOUNTER — Telehealth: Payer: Self-pay | Admitting: *Deleted

## 2018-03-07 ENCOUNTER — Other Ambulatory Visit: Payer: Self-pay | Admitting: *Deleted

## 2018-03-07 ENCOUNTER — Other Ambulatory Visit: Payer: Self-pay | Admitting: General Surgery

## 2018-03-07 DIAGNOSIS — D0501 Lobular carcinoma in situ of right breast: Secondary | ICD-10-CM

## 2018-03-07 NOTE — Telephone Encounter (Signed)
Patient's surgery has been scheduled for 03-26-18 at [facility] with Dr. Bary Castilla. This is per patient's request to wait till the last week in February for surgery. The patient is aware to check in at the [facility] at 8:30 am on 03-26-18.  The patient is aware she will be contacted by the [facility] to complete a phone interview sometime between 1 and 5 pm on 03-15-18.  The patient is aware to call the office should she have further questions.

## 2018-03-07 NOTE — Telephone Encounter (Signed)
-----   Message from Sherrie Sport sent at 03/07/2018  1:38 PM EST ----- 8:30am check in time ----- Message ----- From: Dominga Ferry, CMA Sent: 03/07/2018  11:10 AM EST To: Sherrie Sport  Patient needs to be scheduled for a needle loc right breast on 03-26-18. Surgery at 11:10 am. Order in [place]. Can you please let me know what time patient will need to report to [facility]? Thanks

## 2018-03-08 ENCOUNTER — Other Ambulatory Visit: Payer: Self-pay | Admitting: General Surgery

## 2018-03-08 DIAGNOSIS — D0501 Lobular carcinoma in situ of right breast: Secondary | ICD-10-CM

## 2018-03-15 ENCOUNTER — Encounter
Admission: RE | Admit: 2018-03-15 | Discharge: 2018-03-15 | Disposition: A | Discharge status: Home / Self Care | Payer: BLUE CROSS/BLUE SHIELD | Source: Ambulatory Visit | Attending: General Surgery | Admitting: General Surgery

## 2018-03-15 ENCOUNTER — Other Ambulatory Visit: Payer: Self-pay

## 2018-03-15 HISTORY — DX: Gastro-esophageal reflux disease without esophagitis: K21.9

## 2018-03-15 HISTORY — DX: Adverse effect of unspecified anesthetic, initial encounter: T41.45XA

## 2018-03-15 HISTORY — DX: Other specified postprocedural states: Z98.890

## 2018-03-15 HISTORY — DX: Other complications of anesthesia, initial encounter: T88.59XA

## 2018-03-15 HISTORY — DX: Nontoxic single thyroid nodule: E04.1

## 2018-03-15 HISTORY — DX: Other specified cardiac arrhythmias: I49.8

## 2018-03-15 HISTORY — DX: Other specified postprocedural states: R11.2

## 2018-03-15 HISTORY — DX: Cardiac murmur, unspecified: R01.1

## 2018-03-15 HISTORY — DX: Unspecified osteoarthritis, unspecified site: M19.90

## 2018-03-15 HISTORY — DX: Malignant (primary) neoplasm, unspecified: C80.1

## 2018-03-15 NOTE — Patient Instructions (Signed)
Your procedure is scheduled on: 03-26-18 Report to [facility] @ 8:30 AM  Remember: Instructions that are not followed completely may result in serious medical risk, up to and including death, or upon the discretion of your surgeon and anesthesiologist your surgery may need to be rescheduled.    _x___ 1. Do not eat food after midnight the night before your procedure. You may drink clear liquids up to 2 hours before you are scheduled to arrive at the hospital for your procedure.  Do not drink clear liquids within 2 hours of your scheduled arrival to the hospital.  Clear liquids include  --Water or Apple juice without pulp  --Clear carbohydrate beverage such as ClearFast or Gatorade  --Black Coffee or Clear Tea (No milk, no creamers, do not add anything to  the coffee or Tea   ____Ensure clear carbohydrate drink on the way to the hospital for bariatric patients  ____Ensure clear carbohydrate drink 3 hours before surgery for Dr Dwyane Luo patients if physician instructed.   No gum chewing or hard candies.     __x__ 2. No Alcohol for 24 hours before or after surgery.   __x__3. No Smoking or e-cigarettes for 24 prior to surgery.  Do not use any chewable tobacco products for at least 6 hour prior to surgery   ____  4. Bring all medications with you on the day of surgery if instructed.    __x__ 5. Notify your doctor if there is any change in your medical condition     (cold, fever, infections).    x___6. On the morning of surgery brush your teeth with toothpaste and water.  You may rinse your mouth with mouth wash if you wish.  Do not swallow any toothpaste or mouthwash.   Do not wear jewelry, make-up, hairpins, clips or nail polish.  Do not wear lotions, powders, or perfumes. You may wear deodorant.  Do not shave 48 hours prior to surgery. Men may shave face and neck.  Do not bring valuables to the hospital.    [facility] is not responsible for any belongings or valuables.    Contacts, dentures or bridgework may not be worn into surgery.  Leave your suitcase in the car. After surgery it may be brought to your room.  For patients admitted to the hospital, discharge time is determined by your treatment team.  _  Patients discharged the day of surgery will not be allowed to drive home.  You will need someone to drive you home and stay with you the night of your procedure.    Please read over the following fact sheets that you were given:   [facility] Preparing for Surgery   ____ Take anti-hypertensive listed below, cardiac, seizure, asthma, anti-reflux and psychiatric medicines. These include:  1. NONE  2.  3.  4.  5.  6.  ____Fleets enema or Magnesium Citrate as directed.   ____ Use CHG Soap or sage wipes as directed on instruction sheet   ____ Use inhalers on the day of surgery and bring to hospital day of surgery  ____ Stop Metformin and Janumet 2 days prior to surgery.    ____ Take 1/2 of usual insulin dose the night before surgery and none on the morning surgery.   ____ Follow recommendations from Cardiologist, Pulmonologist or PCP regarding stopping Aspirin, Coumadin, Plavix ,Eliquis, Effient, or Pradaxa, and Pletal.  X____Stop Anti-inflammatories such as Advil, Aleve, Ibuprofen, Motrin, Naproxen, Naprosyn, Goodies powders or aspirin products NOW-OK to take Tylenol  _x___ Stop supplements until after surgery-STOP PAPAYA ENZYME NOW-MAY RESUME AFTER SURGERY   ____ Bring C-Pap to the hospital.

## 2018-03-15 NOTE — Pre-Procedure Instructions (Signed)
ECG 12 lead8/27/2018 [facility] Component Name Value Ref Range  EKG Systolic BP  mmHg  EKG Diastolic BP  mmHg  EKG Ventricular Rate 83 BPM  EKG Atrial Rate 83 BPM  EKG P-R Interval 156 ms  EKG QRS Duration 80 ms  EKG Q-T Interval 382 ms  EKG QTC Calculation 448 ms  EKG Calculated P Axis 71 degrees  EKG Calculated R Axis 81 degrees  EKG Calculated T Axis 59 degrees  Result Narrative  NORMAL SINUS RHYTHM NORMAL ECG NO PREVIOUS ECGS AVAILABLE Confirmed by ROSE-JONES, LISA ([id number]) on 09/26/2016 4:17:00 PM  Other Result Information  Interface, Rad Results In - 09/26/2016  4:17 PM EDT NORMAL SINUS RHYTHM NORMAL ECG NO PREVIOUS ECGS AVAILABLE Confirmed by ROSE-JONES, LISA ([id number]) on 09/26/2016 4:17:00 PM

## 2018-03-23 ENCOUNTER — Encounter: Payer: Self-pay | Admitting: *Deleted

## 2018-03-26 ENCOUNTER — Encounter: Payer: Self-pay | Admitting: *Deleted

## 2018-03-26 ENCOUNTER — Ambulatory Visit
Admission: RE | Admit: 2018-03-26 | Discharge: 2018-03-26 | Disposition: A | Discharge status: Home / Self Care | Payer: BLUE CROSS/BLUE SHIELD | Source: Ambulatory Visit | Attending: General Surgery | Admitting: General Surgery

## 2018-03-26 ENCOUNTER — Encounter
Admission: RE | Disposition: A | Discharge status: Home / Self Care | Payer: Self-pay | Source: Home / Self Care | Attending: General Surgery

## 2018-03-26 ENCOUNTER — Ambulatory Visit: Payer: BLUE CROSS/BLUE SHIELD | Admitting: Registered Nurse

## 2018-03-26 ENCOUNTER — Ambulatory Visit
Admission: RE | Admit: 2018-03-26 | Discharge: 2018-03-26 | Disposition: A | Discharge status: Home / Self Care | Payer: BLUE CROSS/BLUE SHIELD | Attending: General Surgery | Admitting: General Surgery

## 2018-03-26 ENCOUNTER — Other Ambulatory Visit: Payer: Self-pay

## 2018-03-26 DIAGNOSIS — Z803 Family history of malignant neoplasm of breast: Secondary | ICD-10-CM | POA: Insufficient documentation

## 2018-03-26 DIAGNOSIS — Z8249 Family history of ischemic heart disease and other diseases of the circulatory system: Secondary | ICD-10-CM | POA: Diagnosis not present

## 2018-03-26 DIAGNOSIS — M1990 Unspecified osteoarthritis, unspecified site: Secondary | ICD-10-CM | POA: Diagnosis not present

## 2018-03-26 DIAGNOSIS — K219 Gastro-esophageal reflux disease without esophagitis: Secondary | ICD-10-CM | POA: Diagnosis not present

## 2018-03-26 DIAGNOSIS — C50411 Malignant neoplasm of upper-outer quadrant of right female breast: Secondary | ICD-10-CM

## 2018-03-26 DIAGNOSIS — D0501 Lobular carcinoma in situ of right breast: Secondary | ICD-10-CM

## 2018-03-26 DIAGNOSIS — N6091 Unspecified benign mammary dysplasia of right breast: Secondary | ICD-10-CM | POA: Diagnosis not present

## 2018-03-26 DIAGNOSIS — Z8614 Personal history of Methicillin resistant Staphylococcus aureus infection: Secondary | ICD-10-CM | POA: Insufficient documentation

## 2018-03-26 DIAGNOSIS — Z880 Allergy status to penicillin: Secondary | ICD-10-CM | POA: Insufficient documentation

## 2018-03-26 DIAGNOSIS — D493 Neoplasm of unspecified behavior of breast: Secondary | ICD-10-CM | POA: Diagnosis present

## 2018-03-26 HISTORY — PX: BREAST LUMPECTOMY WITH NEEDLE LOCALIZATION: SHX5759

## 2018-03-26 HISTORY — DX: Personal history of Methicillin resistant Staphylococcus aureus infection: Z86.14

## 2018-03-26 HISTORY — PX: BREAST EXCISIONAL BIOPSY: SUR124

## 2018-03-26 HISTORY — PX: BREAST LUMPECTOMY: SHX2

## 2018-03-26 SURGERY — BREAST LUMPECTOMY WITH NEEDLE LOCALIZATION
Anesthesia: General | Laterality: Right

## 2018-03-26 MED ORDER — PROPOFOL 10 MG/ML IV BOLUS
INTRAVENOUS | Status: AC
  Filled 2018-03-26: qty 20

## 2018-03-26 MED ORDER — GABAPENTIN 300 MG PO CAPS
ORAL_CAPSULE | ORAL | Status: AC
  Administered 2018-03-26: 300 mg via ORAL
  Filled 2018-03-26: qty 1

## 2018-03-26 MED ORDER — FENTANYL CITRATE (PF) 100 MCG/2ML IJ SOLN: 25.0000 ug | INTRAMUSCULAR | Status: DC | PRN

## 2018-03-26 MED ORDER — LIDOCAINE HCL (PF) 2 % IJ SOLN
INTRAMUSCULAR | Status: AC
  Filled 2018-03-26: qty 10

## 2018-03-26 MED ORDER — HYDROCODONE-ACETAMINOPHEN 5-325 MG PO TABS: 1.0000 | ORAL_TABLET | ORAL | 0 refills | Status: DC | PRN

## 2018-03-26 MED ORDER — OXYCODONE HCL 5 MG/5ML PO SOLN: 5.0000 mg | Freq: Once | ORAL | Status: DC | PRN

## 2018-03-26 MED ORDER — MIDAZOLAM HCL 2 MG/2ML IJ SOLN
INTRAMUSCULAR | Status: AC
  Filled 2018-03-26: qty 2

## 2018-03-26 MED ORDER — MEPERIDINE HCL 50 MG/ML IJ SOLN: 6.2500 mg | INTRAMUSCULAR | Status: DC | PRN

## 2018-03-26 MED ORDER — ACETAMINOPHEN 10 MG/ML IV SOLN
INTRAVENOUS | Status: DC | PRN
  Administered 2018-03-26: 1000 mg via INTRAVENOUS

## 2018-03-26 MED ORDER — ONDANSETRON HCL 4 MG/2ML IJ SOLN
INTRAMUSCULAR | Status: DC | PRN
  Administered 2018-03-26: 4 mg via INTRAVENOUS

## 2018-03-26 MED ORDER — FAMOTIDINE 20 MG PO TABS
ORAL_TABLET | ORAL | Status: AC
  Administered 2018-03-26: 20 mg via ORAL
  Filled 2018-03-26: qty 1

## 2018-03-26 MED ORDER — ONDANSETRON HCL 4 MG/2ML IJ SOLN
INTRAMUSCULAR | Status: AC
  Filled 2018-03-26: qty 2

## 2018-03-26 MED ORDER — PROMETHAZINE HCL 25 MG/ML IJ SOLN: 6.2500 mg | INTRAMUSCULAR | Status: DC | PRN

## 2018-03-26 MED ORDER — LACTATED RINGERS IV SOLN
INTRAVENOUS | Status: DC
  Administered 2018-03-26: 10:00:00 via INTRAVENOUS

## 2018-03-26 MED ORDER — GABAPENTIN 300 MG PO CAPS
300.0000 mg | ORAL_CAPSULE | ORAL | Status: AC
  Administered 2018-03-26: 300 mg via ORAL

## 2018-03-26 MED ORDER — ACETAMINOPHEN 10 MG/ML IV SOLN
INTRAVENOUS | Status: AC
  Filled 2018-03-26: qty 100

## 2018-03-26 MED ORDER — PROPOFOL 500 MG/50ML IV EMUL
INTRAVENOUS | Status: AC
  Filled 2018-03-26: qty 50

## 2018-03-26 MED ORDER — MIDAZOLAM HCL 2 MG/2ML IJ SOLN
INTRAMUSCULAR | Status: DC | PRN
  Administered 2018-03-26: 2 mg via INTRAVENOUS

## 2018-03-26 MED ORDER — PROPOFOL 10 MG/ML IV BOLUS
INTRAVENOUS | Status: DC | PRN
  Administered 2018-03-26: 120 mg via INTRAVENOUS
  Administered 2018-03-26: 70 mg via INTRAVENOUS

## 2018-03-26 MED ORDER — FAMOTIDINE 20 MG PO TABS
20.0000 mg | ORAL_TABLET | Freq: Once | ORAL | Status: AC
  Administered 2018-03-26: 20 mg via ORAL

## 2018-03-26 MED ORDER — FENTANYL CITRATE (PF) 100 MCG/2ML IJ SOLN
INTRAMUSCULAR | Status: DC | PRN
  Administered 2018-03-26: 50 ug via INTRAVENOUS

## 2018-03-26 MED ORDER — GLYCOPYRROLATE 0.2 MG/ML IJ SOLN
INTRAMUSCULAR | Status: AC
  Filled 2018-03-26: qty 1

## 2018-03-26 MED ORDER — OXYCODONE HCL 5 MG PO TABS: 5.0000 mg | ORAL_TABLET | Freq: Once | ORAL | Status: DC | PRN

## 2018-03-26 MED ORDER — DEXAMETHASONE SODIUM PHOSPHATE 10 MG/ML IJ SOLN
INTRAMUSCULAR | Status: DC | PRN
  Administered 2018-03-26: 5 mg via INTRAVENOUS

## 2018-03-26 MED ORDER — PHENYLEPHRINE HCL 10 MG/ML IJ SOLN
INTRAMUSCULAR | Status: DC | PRN
  Administered 2018-03-26: 100 ug via INTRAVENOUS
  Administered 2018-03-26: 200 ug via INTRAVENOUS
  Administered 2018-03-26: 100 ug via INTRAVENOUS
  Administered 2018-03-26: 200 ug via INTRAVENOUS

## 2018-03-26 MED ORDER — PROPOFOL 500 MG/50ML IV EMUL
INTRAVENOUS | Status: DC | PRN
  Administered 2018-03-26: 75 ug/kg/min via INTRAVENOUS

## 2018-03-26 MED ORDER — LIDOCAINE HCL (CARDIAC) PF 100 MG/5ML IV SOSY
PREFILLED_SYRINGE | INTRAVENOUS | Status: DC | PRN
  Administered 2018-03-26: 60 mg via INTRAVENOUS

## 2018-03-26 MED ORDER — FENTANYL CITRATE (PF) 100 MCG/2ML IJ SOLN
INTRAMUSCULAR | Status: AC
  Filled 2018-03-26: qty 2

## 2018-03-26 MED ORDER — BUPIVACAINE-EPINEPHRINE (PF) 0.5% -1:200000 IJ SOLN
INTRAMUSCULAR | Status: DC | PRN
  Administered 2018-03-26: 20 mL via PERINEURAL

## 2018-03-26 MED ORDER — SUGAMMADEX SODIUM 200 MG/2ML IV SOLN
INTRAVENOUS | Status: AC
  Filled 2018-03-26: qty 2

## 2018-03-26 SURGICAL SUPPLY — 57 items
BINDER BREAST LRG (GAUZE/BANDAGES/DRESSINGS) IMPLANT
BINDER BREAST MEDIUM (GAUZE/BANDAGES/DRESSINGS) ×2 IMPLANT
BINDER BREAST XLRG (GAUZE/BANDAGES/DRESSINGS) IMPLANT
BINDER BREAST XXLRG (GAUZE/BANDAGES/DRESSINGS) IMPLANT
BLADE SURG 15 STRL SS SAFETY (BLADE) ×6 IMPLANT
BULB RESERV EVAC DRAIN JP 100C (MISCELLANEOUS) IMPLANT
CANISTER SUCT 1200ML W/VALVE (MISCELLANEOUS) ×3 IMPLANT
CHLORAPREP W/TINT 26ML (MISCELLANEOUS) ×3 IMPLANT
CLOSURE WOUND 1/2 X4 (GAUZE/BANDAGES/DRESSINGS) ×1
CNTNR SPEC 2.5X3XGRAD LEK (MISCELLANEOUS)
CONT SPEC 4OZ STER OR WHT (MISCELLANEOUS)
CONTAINER SPEC 2.5X3XGRAD LEK (MISCELLANEOUS) IMPLANT
COVER PROBE FLX POLY STRL (MISCELLANEOUS) ×3 IMPLANT
COVER WAND RF STERILE (DRAPES) ×1 IMPLANT
DEVICE DUBIN SPECIMEN MAMMOGRA (MISCELLANEOUS) ×3 IMPLANT
DRAIN CHANNEL JP 15F RND 16 (MISCELLANEOUS) IMPLANT
DRAPE LAPAROTOMY TRNSV 106X77 (MISCELLANEOUS) ×3 IMPLANT
DRSG GAUZE FLUFF 36X18 (GAUZE/BANDAGES/DRESSINGS) ×6 IMPLANT
DRSG TELFA 3X8 NADH (GAUZE/BANDAGES/DRESSINGS) ×3 IMPLANT
ELECT CAUTERY BLADE TIP 2.5 (TIP) ×3
ELECT REM PT RETURN 9FT ADLT (ELECTROSURGICAL) ×3
ELECTRODE CAUTERY BLDE TIP 2.5 (TIP) ×1 IMPLANT
ELECTRODE REM PT RTRN 9FT ADLT (ELECTROSURGICAL) ×1 IMPLANT
GAUZE SPONGE 4X4 12PLY STRL (GAUZE/BANDAGES/DRESSINGS) ×1 IMPLANT
GLOVE BIO SURGEON STRL SZ7.5 (GLOVE) ×5 IMPLANT
GLOVE INDICATOR 8.0 STRL GRN (GLOVE) ×5 IMPLANT
GOWN STRL REUS W/ TWL LRG LVL3 (GOWN DISPOSABLE) ×2 IMPLANT
GOWN STRL REUS W/TWL LRG LVL3 (GOWN DISPOSABLE) ×4
KIT TURNOVER KIT A (KITS) ×3 IMPLANT
LABEL OR SOLS (LABEL) ×3 IMPLANT
MARGIN MAP 10MM (MISCELLANEOUS) ×3 IMPLANT
NDL HYPO 25X1 1.5 SAFETY (NEEDLE) ×2 IMPLANT
NEEDLE HYPO 22GX1.5 SAFETY (NEEDLE) ×1 IMPLANT
NEEDLE HYPO 25X1 1.5 SAFETY (NEEDLE) ×3 IMPLANT
PACK BASIN MINOR ARMC (MISCELLANEOUS) ×3 IMPLANT
PAD DRESSING TELFA 3X8 NADH (GAUZE/BANDAGES/DRESSINGS) ×1 IMPLANT
RETRACTOR RING XSMALL (MISCELLANEOUS) ×1 IMPLANT
RTRCTR WOUND ALEXIS 13CM XS SH (MISCELLANEOUS)
SHEARS FOC LG CVD HARMONIC 17C (MISCELLANEOUS) IMPLANT
SHEARS HARMONIC 9CM CVD (BLADE) ×1 IMPLANT
SLEVE PROBE SENORX GAMMA FIND (MISCELLANEOUS) ×3 IMPLANT
STRIP CLOSURE SKIN 1/2X4 (GAUZE/BANDAGES/DRESSINGS) ×2 IMPLANT
SUT ETHILON 3-0 FS-10 30 BLK (SUTURE) ×3
SUT SILK 2 0 (SUTURE) ×2
SUT SILK 2-0 18XBRD TIE 12 (SUTURE) ×1 IMPLANT
SUT VIC AB 2-0 CT1 27 (SUTURE) ×4
SUT VIC AB 2-0 CT1 TAPERPNT 27 (SUTURE) ×3 IMPLANT
SUT VIC AB 3-0 SH 27 (SUTURE) ×2
SUT VIC AB 3-0 SH 27X BRD (SUTURE) ×2 IMPLANT
SUT VIC AB 4-0 FS2 27 (SUTURE) ×6 IMPLANT
SUT VICRYL+ 3-0 144IN (SUTURE) ×3 IMPLANT
SUTURE EHLN 3-0 FS-10 30 BLK (SUTURE) ×1 IMPLANT
SWABSTK COMLB BENZOIN TINCTURE (MISCELLANEOUS) ×3 IMPLANT
SYR 10ML LL (SYRINGE) ×3 IMPLANT
SYR BULB IRRIG 60ML STRL (SYRINGE) ×3 IMPLANT
TAPE TRANSPORE STRL 2 31045 (GAUZE/BANDAGES/DRESSINGS) ×1 IMPLANT
WATER STERILE IRR 1000ML POUR (IV SOLUTION) ×3 IMPLANT

## 2018-03-26 NOTE — Anesthesia Procedure Notes (Signed)
Procedure Name: LMA Insertion Date/Time: 03/26/2018 11:59 AM Performed by: Dionne Bucy, CRNA Pre-anesthesia Checklist: Patient identified, Patient being monitored, Timeout performed, Emergency Drugs available and Suction available Patient Re-evaluated:Patient Re-evaluated prior to induction Oxygen Delivery Method: Circle system utilized Preoxygenation: Pre-oxygenation with 100% oxygen Induction Type: IV induction Ventilation: Mask ventilation without difficulty LMA: LMA inserted LMA Size: 3.5 Tube type: Oral Number of attempts: 1 Placement Confirmation: positive ETCO2 and breath sounds checked- equal and bilateral Tube secured with: Tape Dental Injury: Teeth and Oropharynx as per pre-operative assessment

## 2018-03-26 NOTE — Transfer of Care (Signed)
Immediate Anesthesia Transfer of Care Note  Patient: Sarah Hurley  Procedure(s) Performed: Procedure(s): RIGHT BREAST WIDE EXCISION WITH NEEDLE LOCALIZATION (Right)  Patient Location: PACU  Anesthesia Type:General  Level of Consciousness: sedated  Airway & Oxygen Therapy: Patient Spontanous Breathing and Patient connected to face mask oxygen  Post-op Assessment: Report given to RN and Post -op Vital signs reviewed and stable  Post vital signs: Reviewed and stable  Last Vitals:  Vitals:   03/26/18 1300 03/26/18 1305  BP:  (!) 93/52  Pulse: 63 65  Resp: (!) 9 13  Temp: (!) 36.3 C (!) 36.4 C  SpO2: 95% 58%    Complications: No apparent anesthesia complications

## 2018-03-26 NOTE — Anesthesia Post-op Follow-up Note (Signed)
Anesthesia QCDR form completed.        

## 2018-03-26 NOTE — Anesthesia Preprocedure Evaluation (Signed)
Anesthesia Evaluation  Patient identified by MRN, date of birth, ID band Patient awake    Reviewed: Allergy & Precautions, NPO status , Patient's Chart, lab work & pertinent test results  History of Anesthesia Complications (+) PONV and history of anesthetic complications  Airway Mallampati: II  TM Distance: >3 FB Neck ROM: Full    Dental no notable dental hx.    Pulmonary neg pulmonary ROS, neg sleep apnea, neg COPD,    breath sounds clear to auscultation- rhonchi (-) wheezing      Cardiovascular Exercise Tolerance: Good (-) hypertension(-) CAD, (-) Past MI, (-) Cardiac Stents and (-) CABG  Rhythm:Regular Rate:Normal - Systolic murmurs and - Diastolic murmurs    Neuro/Psych neg Seizures negative neurological ROS  negative psych ROS   GI/Hepatic Neg liver ROS, GERD  ,  Endo/Other  negative endocrine ROSneg diabetes  Renal/GU negative Renal ROS     Musculoskeletal  (+) Arthritis ,   Abdominal (+) - obese,   Peds  Hematology negative hematology ROS (+)   Anesthesia Other Findings Past Medical History: No date: Arthritis 12/20/2017: Breast mass, right     Comment:  PLEOMORPHIC LOBULAR CARCINOMA IN SITU No date: Cancer ([place])     Comment:  no hx of cancer per pt No date: Complication of anesthesia     Comment:  PT STATES THAT ANESTHESIA HAS ALWAYS USE THE ANESTHESIA               THAT IS GIVEN TO HEART PATIENTS No date: Fluttering heart No date: GERD (gastroesophageal reflux disease)     Comment:  OCC No date: Heart murmur No date: History of methicillin resistant staphylococcus aureus (MRSA) No date: PONV (postoperative nausea and vomiting) No date: Seasonal allergies No date: Thyroid nodule   Reproductive/Obstetrics                             Anesthesia Physical Anesthesia Plan  ASA: II  Anesthesia Plan: General   Post-op Pain Management:    Induction: Intravenous  PONV  Risk Score and Plan: 3 and Dexamethasone, Ondansetron, Midazolam and Propofol infusion  Airway Management Planned: LMA  Additional Equipment:   Intra-op Plan:   Post-operative Plan:   Informed Consent: I have reviewed the patients History and Physical, chart, labs and discussed the procedure including the risks, benefits and alternatives for the proposed anesthesia with the patient or authorized representative who has indicated his/her understanding and acceptance.     Dental advisory given  Plan Discussed with: CRNA and Anesthesiologist  Anesthesia Plan Comments:         Anesthesia Quick Evaluation

## 2018-03-26 NOTE — Discharge Instructions (Signed)

## 2018-03-26 NOTE — Anesthesia Postprocedure Evaluation (Signed)
Anesthesia Post Note  Patient: Sarah Hurley  Procedure(s) Performed: RIGHT BREAST WIDE EXCISION WITH NEEDLE LOCALIZATION (Right )  Patient location during evaluation: PACU Anesthesia Type: General Level of consciousness: awake and alert and oriented Pain management: pain level controlled Vital Signs Assessment: post-procedure vital signs reviewed and stable Respiratory status: spontaneous breathing, nonlabored ventilation and respiratory function stable Cardiovascular status: blood pressure returned to baseline and stable Postop Assessment: no signs of nausea or vomiting Anesthetic complications: no     Last Vitals:  Vitals:   03/26/18 1346 03/26/18 1414  BP: 119/66 (!) 117/55  Pulse: 71   Resp: 18   Temp: (!) 36.3 C   SpO2: 98% 99%    Last Pain:  Vitals:   03/26/18 1346  TempSrc: Temporal  PainSc: 0-No pain                 [name]

## 2018-03-26 NOTE — Op Note (Signed)
Preoperative diagnosis: Pleomorphic LCIS of the right breast.  Postoperative diagnosis: Same  Operative procedure: Wide excision right breast pleomorphic LCIS with wire localization.  Operating Surgeon: Hervey Ard, MD.  Anesthesia: General by LMA, Marcaine 0.5% with 1-200,000 notes of epinephrine, 20 cc.  Estimated blood loss: Less than 5 cc.  Clinical note: This 63 year old woman had an area of microcalcifications noted and stereotactic biopsy showed evidence of pleomorphic LCIS.  She is felt to be a candidate for wide excision.  The biopsy cavity was not evident on preoperative ultrasound and she successfully underwent wire localization today.  Operative note: With the patient under adequate general anesthesia the breast was prepped with ChloraPrep and draped.  Field block anesthesia with Marcaine was achieved.  Ultrasound was used to identify the tip of the wire just at the edge of the areola at the 12 o'clock position.  A circumareolar incision from the 9 to 3 o'clock position was made carried down through skin and subtendinous tissue.  Hemostasis was electrocautery.  The adipose layer was elevated off the underlying breast parenchyma.  The localizing wire was brought into the field and a 2 x 2 x 4 cm block of tissue excised, orientated and sent for specimen radiograph.  This showed the tip of the wire as well as the previously placed clip in place and subsequent report from laboratory showed the medial margin was closest at 3 mm.  Hemostasis was electrocautery.  The superficial aspect of the very thick, dense breast parenchyma was approximated with interrupted 2-0 Vicryl figure-of-eight sutures.  This left a small cavity of the volume of about 3 cc between the deep tissue and the superficial closure.  This was filled with local anesthetic.  The adipose layer was approximated with a layer of 2-0 Vicryl figure-of-eight sutures.  Deep dermal sutures of interrupted subcuticular 2-0 Vicryl were  placed followed by a running 4-0 Vicryl subcuticular suture.  Benzoin and Steri-Strips followed by Telfa, fluff gauze and a compressive wrap were applied.  The patient tolerated the procedure well and was taken to recovery room in stable condition.

## 2018-03-26 NOTE — H&P (Signed)
No change in clinical condition or exam. For right breast wide excision.

## 2018-03-28 LAB — SURGICAL PATHOLOGY

## 2018-04-03 ENCOUNTER — Ambulatory Visit (INDEPENDENT_AMBULATORY_CARE_PROVIDER_SITE_OTHER): Payer: BLUE CROSS/BLUE SHIELD | Admitting: General Surgery

## 2018-04-03 ENCOUNTER — Encounter: Payer: Self-pay | Admitting: General Surgery

## 2018-04-03 ENCOUNTER — Other Ambulatory Visit: Payer: Self-pay

## 2018-04-03 VITALS — BP 148/85 | HR 96 | Temp 97.7°F | Resp 12 | Ht 63.0 in | Wt 107.6 lb

## 2018-04-03 DIAGNOSIS — D0501 Lobular carcinoma in situ of right breast: Secondary | ICD-10-CM

## 2018-04-03 NOTE — Patient Instructions (Signed)
The patient is aware to call back for any questions or new concerns.  

## 2018-04-03 NOTE — Progress Notes (Signed)
Patient ID: Sarah Hurley, female   DOB: 06-19-1955, 63 y.o.   MRN: [id number]  Chief Complaint  Patient presents with  . Routine Post Op    post op right breast wide excision sx 03/26/18    HPI Sarah Hurley is a 63 y.o. female.  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 She states she is doing well, minimal pain.   HPI  Past Medical History:  Diagnosis Date  . Arthritis   . Breast mass, right 12/20/2017   PLEOMORPHIC LOBULAR CARCINOMA IN SITU  . Cancer ([place])    no hx of cancer per pt  . Complication of anesthesia    PT STATES THAT ANESTHESIA HAS ALWAYS USE THE ANESTHESIA THAT IS GIVEN TO HEART PATIENTS  . Fluttering heart   . GERD (gastroesophageal reflux disease)    OCC  . Heart murmur   . History of methicillin resistant staphylococcus aureus (MRSA)   . PONV (postoperative nausea and vomiting)   . Seasonal allergies   . Thyroid nodule     Past Surgical History:  Procedure Laterality Date  . BREAST BIOPSY Right 12/20/2017   stereo bx calcs/x clip/ PLEOMORPHIC LOBULAR CARCINOMA IN SITU  . BREAST CYST ASPIRATION Bilateral 2003   neg  . BREAST EXCISIONAL BIOPSY Right 03/26/2018   NL for LCIS  . BREAST LUMPECTOMY WITH NEEDLE LOCALIZATION Right 03/26/2018   Procedure: RIGHT BREAST WIDE EXCISION WITH NEEDLE LOCALIZATION;  Surgeon: Robert Bellow, MD;  Location: [facility] ORS;  Service: General;  Laterality: Right;  . COLONOSCOPY  2015  . EYE SURGERY     BIL  . TONSILLECTOMY      Family History  Problem Relation Age of Onset  . Hypertension Mother   . Heart disease Father   . Breast cancer Sister 79       DCIS Right Breast    Social History Social History   Tobacco Use  . Smoking status: Never Smoker  . Smokeless tobacco: Never Used  Substance Use Topics  . Alcohol use: Yes    Comment: couple of drinks/day  . Drug use: No    Allergies  Allergen Reactions  . Penicillins Rash    Did it involve swelling of the  face/tongue/throat, SOB, or low BP? No Did it involve sudden or severe rash/hives, skin peeling, or any reaction on the inside of your mouth or nose? Yes Did you need to seek medical attention at a hospital or doctor's office? Unknown When did it last happen?childhood If all above answers are "NO", may proceed with cephalosporin use.     Current Outpatient Medications  Medication Sig Dispense Refill  . fluticasone (FLONASE) 50 MCG/ACT nasal spray Place 1-2 sprays into the nose 2 (two) times daily. 1 SPRAY IN AM, 2 SPRAYS AT BEDTIME    . PAPAYA ENZYME PO Take 1 tablet by mouth daily.    Marland Kitchen triamcinolone cream (KENALOG) 0.1 % Apply 1 application topically 2 (two) times daily as needed (itchy skin).   1   No current facility-administered medications for this visit.     Review of Systems Review of Systems  Constitutional: Negative.   Respiratory: Negative.   Cardiovascular: Negative.     Blood pressure (!) 148/85, pulse 96, temperature 97.7 F (36.5 C), temperature source Temporal, resp. rate 12, height 5\' 3"  (1.6 m), weight 107 lb 9.6 oz (48.8 kg), SpO2 97 %.  Physical Exam Physical Exam Exam conducted with a chaperone present.  Constitutional:  Appearance: Normal appearance.  Chest:       Comments: Steri strips intact right breast  Skin:    General: Skin is warm and dry.  Neurological:     Mental Status: She is alert and oriented to person, place, and time.  Psychiatric:        Mood and Affect: Mood normal.     Data Reviewed March 26, 2018 wide excision: BREAST, RIGHT 12:00; NEEDLE LOCALIZED WIDE EXCISION:  - NEGATIVE FOR RESIDUAL LOBULAR CARCINOMA IN SITU.  - ATYPICAL LOBULAR HYPERPLASIA.  - BIOPSY SITE CHANGE WITH METALLIC CLIP.  - STROMAL SCLEROSIS WITH INVOLUTION OF MAMMARY GLANDS AND ASSOCIATED  MICROCALCIFICATIONS.  The case was discussed with the pathologist who reported there is presently no indication for ER/PR testing for LCIS patients, with no  special recommendation regarding pleomorphic LCIS.   December 20, 2017 core biopsy: A. BREAST, RIGHT; STEREOTACTIC CORE BIOPSY:  - PLEOMORPHIC LOBULAR CARCINOMA IN SITU, WITH NECROSIS AND ASSOCIATED  CALCIFICATIONS, MEASURING AT LEAST 4.5 MM IN GREATEST EXTENT, INVOLVING  3 OF MULTIPLE TISSUE CORES.  - NO EVIDENCE OF INVASIVE CARCINOMA.   Review of the NCCN guidelines shows that lobular carcinoma in situ recommendations have been removed.  Case was discussed informally with radiation oncology: No indication for whole breast radiation at this time.  Assessment Doing well post excisional biopsy of the right breast, no upstaging, no residual pleomorphic LCIS.  Plan The patient was concerned that hormone suppression therapy might affect her thyroid.  She was reassured that these are different systems in the chair should be no effect on her thyroid from estrogen suppression.  A message was left for the patient to contact me regarding the recommendations regarding antiestrogen therapy and radiation at her convenience.  The patient will be offered the opportunity to meet with radiation oncology and again with Dr. Grayland Ormond from medical oncology to discuss antiestrogen therapy.  We will plan for a follow-up examination to reassess wound healing in approximately 1 month.  The patient is aware to call back for any questions or new concerns.     HPI, assessment, plan and physical exam has been scribed under the direction and in the presence of Robert Bellow, MD. Karie Fetch, RN  I have completed the exam and reviewed the above documentation for accuracy and completeness.  I agree with the above.  Haematologist has been used and any errors in dictation or transcription are unintentional.  Hervey Ard, M.D., F.A.C.S.  [name] 04/03/2018, 8:41 PM

## 2018-04-04 ENCOUNTER — Telehealth: Payer: Self-pay | Admitting: General Surgery

## 2018-04-04 NOTE — Telephone Encounter (Signed)
At the time of yesterday's postop visit, it was unclear of the receptor status of her original tumor.  Since that time I have spoken in person with Quay Burow, MD, had a pathology at [facility], who reports that at this time there is no standard for ER/PR testing for LCIS or even pleomorphic LCIS.  I spoke to the patient by phone when she returned my call regarding this information, and my discussion with radiation oncology regarding recommendations for whole breast radiation post surgical resection.  I offered to make arrangements for a formal consultation with radiation oncology.  The patient reported that based on her review of the available information, she had decided she would not pursue breast radiation, and was adverse to considering antiestrogen therapy.  We had a long discussion regarding the "field effect" that LCIS and atypical lobular hyperplasia represents, and that antiestrogen therapy might warrant a reconsideration on her part.  I certainly encouraged her to at least consider a trial of antiestrogen therapy for a minimum of 1 month to see if it was well-tolerated, and if so she would have the benefit of that prophylactic activity with low risk.  She was amenable to a follow-up appointment in 1 month to reassess her surgical site and to discuss her decisions regarding antiestrogen therapy.

## 2018-04-04 NOTE — Telephone Encounter (Signed)
Patient has been scheduled for an appointment with Dr. Bary Castilla for 05-08-18 at 4:30 pm. Patient aware.

## 2018-05-08 ENCOUNTER — Ambulatory Visit: Payer: BLUE CROSS/BLUE SHIELD | Admitting: General Surgery

## 2018-10-10 ENCOUNTER — Encounter: Payer: Self-pay | Admitting: *Deleted

## 2020-02-25 ENCOUNTER — Other Ambulatory Visit: Payer: Self-pay | Admitting: Family Medicine

## 2020-02-25 DIAGNOSIS — Z1231 Encounter for screening mammogram for malignant neoplasm of breast: Secondary | ICD-10-CM

## 2020-03-18 ENCOUNTER — Ambulatory Visit
Admission: RE | Admit: 2020-03-18 | Discharge: 2020-03-18 | Disposition: A | Discharge status: Home / Self Care | Payer: 59 | Source: Ambulatory Visit | Attending: Family Medicine | Admitting: Family Medicine

## 2020-03-18 ENCOUNTER — Other Ambulatory Visit: Payer: Self-pay

## 2020-03-18 DIAGNOSIS — Z1231 Encounter for screening mammogram for malignant neoplasm of breast: Secondary | ICD-10-CM | POA: Insufficient documentation

## 2020-03-19 ENCOUNTER — Other Ambulatory Visit: Payer: Self-pay

## 2020-03-23 ENCOUNTER — Other Ambulatory Visit: Payer: Self-pay | Admitting: Family Medicine

## 2020-03-23 DIAGNOSIS — N6310 Unspecified lump in the right breast, unspecified quadrant: Secondary | ICD-10-CM

## 2020-03-26 ENCOUNTER — Other Ambulatory Visit: Payer: Self-pay

## 2020-03-26 ENCOUNTER — Ambulatory Visit
Admission: RE | Admit: 2020-03-26 | Discharge: 2020-03-26 | Disposition: A | Discharge status: Home / Self Care | Payer: 59 | Source: Ambulatory Visit | Attending: Family Medicine | Admitting: Family Medicine

## 2020-03-26 DIAGNOSIS — N6310 Unspecified lump in the right breast, unspecified quadrant: Secondary | ICD-10-CM | POA: Diagnosis present

## 2020-03-31 ENCOUNTER — Other Ambulatory Visit: Payer: Self-pay | Admitting: Family Medicine

## 2020-03-31 DIAGNOSIS — R928 Other abnormal and inconclusive findings on diagnostic imaging of breast: Secondary | ICD-10-CM

## 2020-03-31 DIAGNOSIS — N6310 Unspecified lump in the right breast, unspecified quadrant: Secondary | ICD-10-CM

## 2020-04-09 ENCOUNTER — Ambulatory Visit
Admission: RE | Admit: 2020-04-09 | Discharge: 2020-04-09 | Disposition: A | Discharge status: Home / Self Care | Payer: 59 | Source: Ambulatory Visit | Attending: Family Medicine | Admitting: Family Medicine

## 2020-04-09 ENCOUNTER — Other Ambulatory Visit: Payer: Self-pay

## 2020-04-09 DIAGNOSIS — N6310 Unspecified lump in the right breast, unspecified quadrant: Secondary | ICD-10-CM | POA: Diagnosis present

## 2020-04-09 DIAGNOSIS — R928 Other abnormal and inconclusive findings on diagnostic imaging of breast: Secondary | ICD-10-CM | POA: Insufficient documentation

## 2020-04-09 HISTORY — PX: BREAST BIOPSY: SHX20

## 2020-04-10 LAB — SURGICAL PATHOLOGY

## 2020-04-13 DIAGNOSIS — C50919 Malignant neoplasm of unspecified site of unspecified female breast: Secondary | ICD-10-CM

## 2020-04-14 NOTE — Progress Notes (Signed)
Navigation initiated.  Patient requests Dr. Bary Castilla, and Dr. Grayland Ormond.  She saw both physicians in 2020 for [facility].  Appointments scheduled.

## 2020-04-15 ENCOUNTER — Other Ambulatory Visit: Payer: Self-pay | Admitting: General Surgery

## 2020-04-15 DIAGNOSIS — C50411 Malignant neoplasm of upper-outer quadrant of right female breast: Secondary | ICD-10-CM

## 2020-04-17 DIAGNOSIS — C50411 Malignant neoplasm of upper-outer quadrant of right female breast: Secondary | ICD-10-CM | POA: Insufficient documentation

## 2020-04-17 NOTE — Progress Notes (Signed)
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  INTERVAL HISTORY: Patient was last evaluated in clinic in January 2020 at which point she had pleomorphic LCIS of right breast and underwent lumpectomy only.  She is referred back for new diagnosis of invasive carcinoma with lobular features.  She currently feels well and is asymptomatic.  She has no neurologic complaints.  She denies any recent fevers or illnesses.  She has good appetite and denies weight loss.  She has no chest pain, shortness of breath, cough, or hemoptysis.  She denies any nausea, vomiting, constipation, or diarrhea.  She has no urinary complaints.  Patient feels at her baseline offers no specific complaints today.  REVIEW OF SYSTEMS:   Review of Systems  Constitutional: Negative.  Negative for fever, malaise/fatigue and weight loss.  Respiratory: Negative.  Negative for cough and hemoptysis.   Cardiovascular: Negative.  Negative for chest pain and leg swelling.  Gastrointestinal: Negative.  Negative for abdominal pain.  Genitourinary: Negative.   Musculoskeletal: Negative.  Negative for back pain.  Skin: Negative.  Negative for rash.  Neurological: Negative.  Negative for dizziness, focal weakness, weakness and headaches.  Psychiatric/Behavioral: Negative.  The patient is not nervous/anxious.     As per HPI. Otherwise, a complete review of systems is negative.  PAST MEDICAL HISTORY: Past Medical History:  Diagnosis Date  . Arthritis   . Breast mass, right 12/20/2017   PLEOMORPHIC LOBULAR CARCINOMA IN SITU  . Cancer ([place])    no hx of cancer per pt  . Complication of anesthesia    PT  STATES THAT ANESTHESIA HAS ALWAYS USE THE ANESTHESIA THAT IS GIVEN TO HEART PATIENTS  . Fluttering heart   . GERD (gastroesophageal reflux disease)    OCC  . Heart murmur   . History of methicillin resistant staphylococcus aureus (MRSA)   . PONV (postoperative nausea and vomiting)   . Seasonal allergies   . Thyroid nodule     PAST SURGICAL HISTORY: Past Surgical History:  Procedure Laterality Date  . BREAST BIOPSY Right 12/20/2017   stereo bx calcs/x clip/ PLEOMORPHIC LOBULAR CARCINOMA IN SITU  . BREAST BIOPSY Right 04/09/2020   u/s bx path pending 10:00 6cmfn Q clip  . BREAST CYST ASPIRATION Bilateral 2003   neg  . BREAST LUMPECTOMY Right 03/26/2018   LCIS  . BREAST LUMPECTOMY WITH NEEDLE LOCALIZATION Right 03/26/2018   Procedure: RIGHT BREAST WIDE EXCISION WITH NEEDLE LOCALIZATION;  Surgeon: Robert Bellow, MD;  Location: [facility] ORS;  Service: General;  Laterality: Right;  . COLONOSCOPY  2015  . EYE SURGERY     BIL  . TONSILLECTOMY      FAMILY HISTORY: Family History  Problem Relation Age of Onset  . Hypertension Mother   . Heart disease Father   . Breast cancer Sister     ADVANCED DIRECTIVES (Y/N):  N  HEALTH MAINTENANCE: Social History   Tobacco Use  . Smoking status: Never Smoker  . Smokeless tobacco: Never Used  Vaping Use  . Vaping Use: Never used  Substance Use Topics  . Alcohol use: Yes    Comment: couple of drinks/day  . Drug use: No     Colonoscopy:  PAP:  Bone  density:  Lipid panel:  Allergies  Allergen Reactions  . Penicillins Rash    Did it involve swelling of the face/tongue/throat, SOB, or low BP? No Did it involve sudden or severe rash/hives, skin peeling, or any reaction on the inside of your mouth or nose? Yes Did you need to seek medical attention at a hospital or doctor's office? Unknown When did it last happen?childhood If all above answers are "NO", may proceed with cephalosporin use.     Current Outpatient Medications   Medication Sig Dispense Refill  . fluticasone (FLONASE) 50 MCG/ACT nasal spray Place 1-2 sprays into the nose 2 (two) times daily. 1 SPRAY IN AM, 2 SPRAYS AT BEDTIME    . PAPAYA ENZYME PO Take 1 tablet by mouth daily.    Marland Kitchen triamcinolone cream (KENALOG) 0.1 % Apply 1 application topically 2 (two) times daily as needed (itchy skin).   1   No current facility-administered medications for this visit.    OBJECTIVE: Vitals:   04/21/20 1123  BP: (!) 155/80  Pulse: 79  Resp: 12  Temp: 98 F (36.7 C)     Body mass index is 18.78 kg/m.    ECOG FS:0 - Asymptomatic  General: Well-developed, well-nourished, no acute distress. Eyes: Pink conjunctiva, anicteric sclera. HEENT: Normocephalic, moist mucous membranes. Breast: Exam performed by surgery recently. Lungs: No audible wheezing or coughing. Heart: Regular rate and rhythm. Abdomen: Soft, nontender, no obvious distention. Musculoskeletal: No edema, cyanosis, or clubbing. Neuro: Alert, answering all questions appropriately. Cranial nerves grossly intact. Skin: No rashes or petechiae noted. Psych: Normal affect. Lymphatics: No cervical, calvicular, axillary or inguinal LAD.   LAB RESULTS:  Lab Results  Component Value Date   NA 140 06/04/2015   K 3.4 (L) 06/04/2015   CL 102 06/04/2015   CO2 25 06/04/2015   GLUCOSE 105 (H) 06/04/2015   BUN 18 06/04/2015   CREATININE 0.93 06/04/2015   CALCIUM 10.2 06/04/2015   PROT 7.7 06/04/2015   ALBUMIN 5.0 06/04/2015   AST 30 06/04/2015   ALT 18 06/04/2015   ALKPHOS 49 06/04/2015   BILITOT 2.3 (H) 06/04/2015   GFRNONAA >60 06/04/2015   GFRAA >60 06/04/2015    Lab Results  Component Value Date   WBC 5.9 06/04/2015   NEUTROABS 3.4 06/04/2015   HGB 18.4 (H) 06/04/2015   HCT 54.5 (H) 06/04/2015   MCV 97.4 06/04/2015   PLT 196 06/04/2015     STUDIES: US BREAST LTD UNI RIGHT INC AXILLA  Addendum Date: 04/09/2020   ADDENDUM REPORT: 04/09/2020 09:31 ADDENDUM: Patient presented for  core needle biopsy of the palpable mass in the outer right breast. The second smaller mass that was detected at 1 o'clock, 4 cm the nipple, could not be confidently reproduced, blending in with other shadowing areas fibroglandular tissue in this location. Only the palpable mass was biopsied. Recommend a six-month follow-up diagnostic mammogram and ultrasound of the area at 1 o'clock in the right breast if the mass at 10 o'clock is benign. Electronically Signed   By: Lajean Manes M.D.   On: 04/09/2020 09:31   Result Date: 04/09/2020 CLINICAL DATA:  Right breast upper outer quadrant area of palpable concern. History of right breast excisional biopsy for LCIS. EXAM: DIGITAL DIAGNOSTIC BILATERAL MAMMOGRAM WITH TOMOSYNTHESIS AND CAD; ULTRASOUND RIGHT BREAST LIMITED TECHNIQUE: Bilateral digital diagnostic mammography and breast tomosynthesis was performed. The images were evaluated with computer-aided detection.; Targeted ultrasound examination of the right breast was performed COMPARISON:  Previous exam(s). ACR Breast Density  Category d: The breast tissue is extremely dense, which lowers the sensitivity of mammography. FINDINGS: Mammographically, there is a newly appreciated round mass in the right breast upper outer quadrant, posterior depth, which corresponds to the area of palpable concern, immediately posterior to pre-existing coarse benign-appearing calcifications. Postsurgical changes are noted in the subareolar right breast from prior excisional biopsy. Targeted right breast ultrasound is performed showing 10 o'clock 6 cm from the nipple irregular hypoechoic ill-defined mass which measures 2.1 x 1.2 x 1.2 cm. This finding corresponds to the mammographically seen and palpable mass. In the right breast 1 o'clock 4 cm from the nipple there is an ill-defined irregular hypoechoic mass which measures 0.6 x 0.7 x 0.6 cm. There is no evidence of right axillary lymphadenopathy. IMPRESSION: Two suspicious right breast  masses, at 10 o'clock and 1 o'clock, for which ultrasound-guided core needle biopsy is recommended. RECOMMENDATION: Two site ultrasound-guided core needle biopsy of the right breast. I have discussed the findings and recommendations with the patient. If applicable, a reminder letter will be sent to the patient regarding the next appointment. BI-RADS CATEGORY  4: Suspicious. Electronically Signed: By: Fidela Salisbury M.D. On: 03/26/2020 14:24   MM DIAG BREAST TOMO BILATERAL  Addendum Date: 04/09/2020   ADDENDUM REPORT: 04/09/2020 09:31 ADDENDUM: Patient presented for core needle biopsy of the palpable mass in the outer right breast. The second smaller mass that was detected at 1 o'clock, 4 cm the nipple, could not be confidently reproduced, blending in with other shadowing areas fibroglandular tissue in this location. Only the palpable mass was biopsied. Recommend a six-month follow-up diagnostic mammogram and ultrasound of the area at 1 o'clock in the right breast if the mass at 10 o'clock is benign. Electronically Signed   By: Lajean Manes M.D.   On: 04/09/2020 09:31   Result Date: 04/09/2020 CLINICAL DATA:  Right breast upper outer quadrant area of palpable concern. History of right breast excisional biopsy for LCIS. EXAM: DIGITAL DIAGNOSTIC BILATERAL MAMMOGRAM WITH TOMOSYNTHESIS AND CAD; ULTRASOUND RIGHT BREAST LIMITED TECHNIQUE: Bilateral digital diagnostic mammography and breast tomosynthesis was performed. The images were evaluated with computer-aided detection.; Targeted ultrasound examination of the right breast was performed COMPARISON:  Previous exam(s). ACR Breast Density Category d: The breast tissue is extremely dense, which lowers the sensitivity of mammography. FINDINGS: Mammographically, there is a newly appreciated round mass in the right breast upper outer quadrant, posterior depth, which corresponds to the area of palpable concern, immediately posterior to pre-existing coarse  benign-appearing calcifications. Postsurgical changes are noted in the subareolar right breast from prior excisional biopsy. Targeted right breast ultrasound is performed showing 10 o'clock 6 cm from the nipple irregular hypoechoic ill-defined mass which measures 2.1 x 1.2 x 1.2 cm. This finding corresponds to the mammographically seen and palpable mass. In the right breast 1 o'clock 4 cm from the nipple there is an ill-defined irregular hypoechoic mass which measures 0.6 x 0.7 x 0.6 cm. There is no evidence of right axillary lymphadenopathy. IMPRESSION: Two suspicious right breast masses, at 10 o'clock and 1 o'clock, for which ultrasound-guided core needle biopsy is recommended. RECOMMENDATION: Two site ultrasound-guided core needle biopsy of the right breast. I have discussed the findings and recommendations with the patient. If applicable, a reminder letter will be sent to the patient regarding the next appointment. BI-RADS CATEGORY  4: Suspicious. Electronically Signed: By: Fidela Salisbury M.D. On: 03/26/2020 14:24   MM CLIP PLACEMENT RIGHT  Result Date: 04/09/2020 CLINICAL DATA:  Evaluate post biopsy  marker clip placement following ultrasound-guided core needle biopsy of a palpable right breast mass. EXAM: DIAGNOSTIC RIGHT MAMMOGRAM POST ULTRASOUND BIOPSY COMPARISON:  Previous exam(s). FINDINGS: Mammographic images were obtained following ultrasound guided biopsy of a palpable right breast mass. The biopsy marking clip is in expected position at the site of biopsy. IMPRESSION: Appropriate positioning of the Q shaped biopsy marking clip at the site of biopsy in the upper outer quadrant of the right breast posterior to a benign dystrophic area of calcification, within the masslike opacity. Final Assessment: Post Procedure Mammograms for Marker Placement Electronically Signed   By: Lajean Manes M.D.   On: 04/09/2020 09:40   [name] RT BREAST BX W LOC DEV 1ST LESION IMG BX SPEC US GUIDE  Addendum Date:  04/13/2020   ADDENDUM REPORT: 04/10/2020 13:44 ADDENDUM: PATHOLOGY revealed: A. BREAST, RIGHT AT 10:00, 6 CM FROM THE NIPPLE; ULTRASOUND-GUIDED CORE NEEDLE BIOPSY: - INVASIVE MAMMARY CARCINOMA, WITH LOBULAR FEATURES. Size of invasive carcinoma: 16 mm in this sample. Grade 2. Ductal carcinoma in situ: Not identified. Lymphovascular invasion: Not identified. Pathology results are CONCORDANT with imaging findings, per Dr. Lajean Manes. Pathology results and recommendations below were discussed with patient by telephone on 04/10/2020. Patient reported biopsy site within normal limits with slight tenderness at the site, and no significant bruising. Post biopsy care instructions were reviewed, questions were answered and my direct phone number was provided to patient. Patient was instructed to call [facility] if any concerns or questions arise related to the biopsy. Recommendation: 1. Surgical consultation: Request for surgical consultation relayed to Al Pimple RN and Tanya Nones RN at [facility] by Electa Sniff RN on 04/10/2020. 2. Consideration of bilateral breast MRI is strongly recommended for staging of breast disease and lobular features. Pathology results reported by Electa Sniff RN on 04/10/2020. Electronically Signed   By: Lajean Manes M.D.   On: 04/10/2020 13:44   Result Date: 04/13/2020 CLINICAL DATA:  Patient presents for ultrasound-guided core needle biopsy of a palpable right breast mass. The patient found a second subtle small mass in the right breast 1 o'clock at diagnostic imaging, for which additional biopsy was recommended. This lesion could not be reproduced the time of biopsy. EXAM: ULTRASOUND GUIDED RIGHT BREAST CORE NEEDLE BIOPSY COMPARISON:  Previous exam(s). PROCEDURE: I met with the patient and we discussed the procedure of ultrasound-guided biopsy, including benefits and alternatives. We discussed the high likelihood of a successful procedure. We discussed the  risks of the procedure, including infection, bleeding, tissue injury, clip migration, and inadequate sampling. Informed written consent was given. The usual time-out protocol was performed immediately prior to the procedure. Lesion quadrant: Upper outer quadrant Using sterile technique and 1% Lidocaine as local anesthetic, under direct ultrasound visualization, a 12 gauge spring-loaded device was used to perform biopsy of masslike abnormality in the 10 o'clock position of right breast, 6 cm the nipple, using an inferior approach. At the conclusion of the procedure Q shaped tissue marker clip was deployed into the biopsy cavity. Follow up 2 view mammogram was performed and dictated separately. IMPRESSION: Ultrasound guided biopsy of a palpable, masslike abnormality upper outer right breast. No apparent complications. Electronically Signed: By: Lajean Manes M.D. 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   PLAN:    1.  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  She did not undergo any adjuvant treatment with letrozole or XRT at that time.  Patient now has an invasive component and is scheduled for breast MRI next week.  Patient has follow-up with surgery who anticipates she will likely need mastectomy.  Patient is unsure if she wishes to pursue reconstruction at this time.  Chemotherapy is typically not helpful with lobular malignancy, but patient has declined anyway, therefore no Oncotype is necessary.  We also discussed the possibility of initiating neoadjuvant letrozole, patient has declined.  Follow-up with surgery as scheduled.  Return to clinic 2 to 3 weeks after her surgery to discuss her final pathology results and initiation of letrozole.    I spent a total of 60 minutes reviewing chart data,  face-to-face evaluation with the patient, counseling and coordination of care as detailed above.   Patient expressed understanding and was in agreement with this plan. She also understands that She can call clinic at any time with any questions, concerns, or complaints.   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

## 2020-04-21 ENCOUNTER — Inpatient Hospital Stay: Payer: 59

## 2020-04-21 ENCOUNTER — Inpatient Hospital Stay: Payer: 59 | Attending: Oncology | Admitting: Oncology

## 2020-04-21 ENCOUNTER — Encounter: Payer: Self-pay | Admitting: Oncology

## 2020-04-21 DIAGNOSIS — Z880 Allergy status to penicillin: Secondary | ICD-10-CM

## 2020-04-21 DIAGNOSIS — Z170 Estrogen receptor positive status [ER+]: Secondary | ICD-10-CM

## 2020-04-21 DIAGNOSIS — C50411 Malignant neoplasm of upper-outer quadrant of right female breast: Secondary | ICD-10-CM | POA: Diagnosis present

## 2020-04-21 DIAGNOSIS — Z79899 Other long term (current) drug therapy: Secondary | ICD-10-CM | POA: Diagnosis not present

## 2020-04-21 DIAGNOSIS — Z8249 Family history of ischemic heart disease and other diseases of the circulatory system: Secondary | ICD-10-CM

## 2020-04-21 DIAGNOSIS — Z803 Family history of malignant neoplasm of breast: Secondary | ICD-10-CM | POA: Diagnosis not present

## 2020-04-21 DIAGNOSIS — N6312 Unspecified lump in the right breast, upper inner quadrant: Secondary | ICD-10-CM

## 2020-04-21 DIAGNOSIS — Z8614 Personal history of Methicillin resistant Staphylococcus aureus infection: Secondary | ICD-10-CM | POA: Diagnosis not present

## 2020-04-21 NOTE — Progress Notes (Signed)
Patient her for follow up to test results and treatment plan.

## 2020-04-28 ENCOUNTER — Other Ambulatory Visit: Payer: Self-pay

## 2020-04-28 ENCOUNTER — Ambulatory Visit
Admission: RE | Admit: 2020-04-28 | Discharge: 2020-04-28 | Disposition: A | Discharge status: Home / Self Care | Payer: 59 | Source: Ambulatory Visit | Attending: General Surgery | Admitting: General Surgery

## 2020-04-28 DIAGNOSIS — C50411 Malignant neoplasm of upper-outer quadrant of right female breast: Secondary | ICD-10-CM | POA: Diagnosis present

## 2020-04-28 MED ORDER — GADOBUTROL 1 MMOL/ML IV SOLN
5.0000 mL | Freq: Once | INTRAVENOUS | Status: AC | PRN
  Administered 2020-04-28: 5 mL via INTRAVENOUS

## 2020-04-30 ENCOUNTER — Other Ambulatory Visit: Payer: Self-pay | Admitting: General Surgery

## 2020-04-30 DIAGNOSIS — C50411 Malignant neoplasm of upper-outer quadrant of right female breast: Secondary | ICD-10-CM

## 2020-05-04 ENCOUNTER — Other Ambulatory Visit: Payer: Self-pay | Admitting: General Surgery

## 2020-05-04 DIAGNOSIS — C50411 Malignant neoplasm of upper-outer quadrant of right female breast: Secondary | ICD-10-CM

## 2020-05-12 ENCOUNTER — Other Ambulatory Visit: Payer: Self-pay

## 2020-05-12 ENCOUNTER — Encounter: Payer: Self-pay | Admitting: Plastic Surgery

## 2020-05-12 ENCOUNTER — Ambulatory Visit (INDEPENDENT_AMBULATORY_CARE_PROVIDER_SITE_OTHER): Payer: 59 | Admitting: Plastic Surgery

## 2020-05-12 VITALS — BP 161/9 | HR 91 | Ht 63.0 in | Wt 104.4 lb

## 2020-05-12 DIAGNOSIS — D0501 Lobular carcinoma in situ of right breast: Secondary | ICD-10-CM

## 2020-05-12 DIAGNOSIS — C50411 Malignant neoplasm of upper-outer quadrant of right female breast: Secondary | ICD-10-CM

## 2020-05-12 NOTE — Progress Notes (Signed)
Patient ID: Sarah Hurley, female    DOB: [birth year], 65 y.o.   MRN: [id number]   Chief Complaint  Patient presents with  . Advice Only    The patient is a 65 year old female here for consultation for breast reconstruction.  She underwent partial mastectomy of the right breast and February 2020.  She did not have radiation.  She was found to have another malignancy in the upper outer quadrant and has decided on a mastectomy.  Her last mammogram was 2/22.  She does have a family history of sister with breast cancer she had biopsy in 2003 which was negative she is not a smoker and does not have diabetes.  She is 5 feet 3 inches tall weighs 104 pounds.  Her preoperative bra size is a 34 B.  She would like to be around the same size or slightly smaller.  She does not want to be larger.  She has a beach trip planned for June and wants to make sure that she can make the trip.  She has a little bit of ptosis and a lot of loss of upper pole fullness.  Her torso is a little bit long.  She is aware of her proportions.  She has a well-healed scar on the upper portion of her right areola.   Review of Systems  Constitutional: Negative.   HENT: Negative.   Eyes: Negative.   Respiratory: Negative.  Negative for chest tightness and shortness of breath.   Cardiovascular: Negative.  Negative for leg swelling.  Gastrointestinal: Negative for abdominal distention and abdominal pain.  Endocrine: Negative.   Genitourinary: Negative.   Musculoskeletal: Negative.   Hematological: Negative.   Psychiatric/Behavioral: Negative.     Past Medical History:  Diagnosis Date  . Arthritis   . Breast mass, right 12/20/2017   PLEOMORPHIC LOBULAR CARCINOMA IN SITU  . Cancer ([place])    no hx of cancer per pt  . Complication of anesthesia    PT STATES THAT ANESTHESIA HAS ALWAYS USE THE ANESTHESIA THAT IS GIVEN TO HEART PATIENTS  . Fluttering heart   . GERD (gastroesophageal reflux disease)    OCC  . Heart murmur    . History of methicillin resistant staphylococcus aureus (MRSA)   . PONV (postoperative nausea and vomiting)   . Seasonal allergies   . Thyroid nodule     Past Surgical History:  Procedure Laterality Date  . BREAST BIOPSY Right 12/20/2017   stereo bx calcs/x clip/ PLEOMORPHIC LOBULAR CARCINOMA IN SITU  . BREAST BIOPSY Right 04/09/2020   u/s bx path pending 10:00 6cmfn Q clip  . BREAST CYST ASPIRATION Bilateral 2003   neg  . BREAST LUMPECTOMY Right 03/26/2018   LCIS  . BREAST LUMPECTOMY WITH NEEDLE LOCALIZATION Right 03/26/2018   Procedure: RIGHT BREAST WIDE EXCISION WITH NEEDLE LOCALIZATION;  Surgeon: Robert Bellow, MD;  Location: [facility] ORS;  Service: General;  Laterality: Right;  . COLONOSCOPY  2015  . EYE SURGERY     BIL  . TONSILLECTOMY        Current Outpatient Medications:  .  fluticasone (FLONASE) 50 MCG/ACT nasal spray, Place 1-2 sprays into the nose 2 (two) times daily. 1 SPRAY IN AM, 2 SPRAYS AT BEDTIME, Disp: , Rfl:  .  PAPAYA ENZYME PO, Take 1 tablet by mouth daily., Disp: , Rfl:  .  triamcinolone cream (KENALOG) 0.1 %, Apply 1 application topically 2 (two) times daily as needed (itchy skin). , Disp: , Rfl: 1  Objective:   Vitals:   05/12/20 1250  BP: (!) 161/9  Pulse: 91  SpO2: 99%    Physical Exam Vitals and nursing note reviewed.  Constitutional:      Appearance: Normal appearance.  HENT:     Head: Normocephalic and atraumatic.  Cardiovascular:     Rate and Rhythm: Normal rate.     Pulses: Normal pulses.  Pulmonary:     Effort: Pulmonary effort is normal.  Abdominal:     General: There is no distension.     Tenderness: There is no abdominal tenderness.  Skin:    General: Skin is warm.     Capillary Refill: Capillary refill takes less than 2 seconds.     Coloration: Skin is not jaundiced.     Findings: No bruising.  Neurological:     General: No focal deficit present.     Mental Status: She is alert and oriented to person, place, and time.      Cranial Nerves: No cranial nerve deficit.     Motor: No weakness.  Psychiatric:        Mood and Affect: Mood normal.        Behavior: Behavior normal.        Thought Content: Thought content normal.     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 Several reconstruction options were explained to the patient.  It is important to remember that breast reconstruction is an optional procedure. Reconstruction often requires several stages of surgery and this means more than one operation.  The surgeries are often done several months apart.  The entire process from start to finish can take a year or more. The major goal of breast reconstruction is to look normal in clothing. There will always be scars and a difference noticeable without clothes.  This is true for asymmetries where both breasts will not be identical.  Surgery may be needed or desired to the non-cancerous breast in order to achieve better symmetry and satisfactory results.  Regardless of the reconstructive method, there is always risks and the possibility that the procedure will fail or have complications.  This could required additional surgeries.    We discussed the available methods of breast reconstruction and included:  1. Tissue expander with Acellular dermal matrix followed by implant based reconstruction. This can be done as one surgery or multiple surgeries.  2. Autologous reconstruction can include using a muscle or tissue from another area of the body for the reconstruction.  3. Combined procedures like the latissismus dorsi flaps that often uses the muscle with an expander or implant.  For each of the method discussed the risks, benefits, scars and recovery time were discussed in detail. Specific risks included bleeding, infection,  hematoma, seroma, scarring, pain, wound healing complications, flap loss, fat necrosis, capsular contracture, need for implant removal, donor site complications, bulge, hernia, umbilical necrosis, need for urgent reoperation, and need for dressing changes.   After the options were discussed we focused on the patient's desires and the procedure that was best for her based on all the information.  A total of 45 minutes of face-to-face time was spent in this encounter, of which >60% was spent in counseling.    Due to the patient's size she is a good candidate for implant-based reconstruction.  She does want to have a right  mastectomy with expander placement.  This would be done at the same time as the mastectomy.  She would have a drain for 2 weeks.  Then she would be expanded slowly over 3 months.  Then she would have the expander removed and the implant placed.  We will send her an email with the reconstruction brochure.  I confirmed the above information is correct with the patient.  I also sent a message to Dr. Bary Castilla so we can organized the combined surgery.   [place], DO

## 2020-05-13 ENCOUNTER — Ambulatory Visit
Admission: RE | Admit: 2020-05-13 | Discharge: 2020-05-13 | Disposition: A | Discharge status: Home / Self Care | Payer: 59 | Source: Ambulatory Visit | Attending: General Surgery | Admitting: General Surgery

## 2020-05-13 DIAGNOSIS — C50411 Malignant neoplasm of upper-outer quadrant of right female breast: Secondary | ICD-10-CM | POA: Insufficient documentation

## 2020-05-19 ENCOUNTER — Telehealth: Payer: Self-pay

## 2020-05-19 NOTE — Telephone Encounter (Signed)
Patient called to say that she has a surgery scheduled for 06/17/2020 (mastectomy with reconstruction) and she is planning to take a family beach trip on 07/18/2020.  Patient would like to know if she will be able to pick up her grandchildren at that time.  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  Please call.

## 2020-05-20 ENCOUNTER — Telehealth: Payer: Self-pay | Admitting: Plastic Surgery

## 2020-05-20 ENCOUNTER — Other Ambulatory Visit: Payer: Self-pay | Admitting: General Surgery

## 2020-05-20 DIAGNOSIS — C50411 Malignant neoplasm of upper-outer quadrant of right female breast: Secondary | ICD-10-CM

## 2020-05-20 NOTE — Progress Notes (Signed)
Subjective:     Patient ID: Sarah Hurley is a 65 y.o. female.  HPI  The following portions of the patient's history were reviewed and updated as appropriate.  This an established patient is here today for: office visit. Patient is here today to follow up from a breast MRI and discuss surgery options.   Patient states she wears a 34B bra size.  The patient had been contacted last night with the results of her MRI scan.  The patient is accompanied today by her husband, Donnie.      Chief Complaint  Patient presents with  . Follow-up     BP 138/84   Pulse 78   Temp 36.2 C (97.1 F)   Ht 160 cm (5\' 3" )   Wt 48.1 kg (106 lb)   SpO2 100%   BMI 18.78 kg/m       Past Medical History:  Diagnosis Date  . Allergy   . Arthritis   . Cardiac murmur   . Colon polyp    HYPERPLASTIC  . Complication of anesthesia   . Fluttering heart   . GERD (gastroesophageal reflux disease)   . History of methicillin resistant Staphylococcus aureus   . PONV (postoperative nausea and vomiting)   . Seasonal allergies   . Thyroid nodule           Past Surgical History:  Procedure Laterality Date  . ASPIRATION CYST BREAST Bilateral 2003   negative  . BREAST EXCISIONAL BIOPSY Right 04/09/2020   ultrasound guided core  . BREAST EXCISIONAL BIOPSY Right 12/20/2017  . COLONOSCOPY  2015  . FINE NEEDLE ASPIRATION  04/29/2013   Ultrasound-guided fine needle aspiration 4.5 cm right-sided thyroid nodule  . history of eye surgery Bilateral   . MASTECTOMY PARTIAL / LUMPECTOMY Right 03/26/2018  . tonsil removal    . TONSILLECTOMY  1976              OB History    Gravida  2   Para  2   Term      Preterm      AB      Living        SAB      IAB      Ectopic      Molar      Multiple      Live Births          Obstetric Comments  Age at first period 33 Age of first pregnancy 69        Social History          Socioeconomic  History  . Marital status: Married    Spouse name: Not on file  . Number of children: Not on file  . Years of education: Not on file  . Highest education level: Not on file  Occupational History  . Occupation: Self employed  Tobacco Use  . Smoking status: Never Smoker  . Smokeless tobacco: Never Used  . Tobacco comment: Does not smoke  Substance and Sexual Activity  . Alcohol use: Yes    Alcohol/week: 2.0 standard drinks    Types: 2 Glasses of wine per week    Comment: 2 glasses of wine per day  . Drug use: Never  . Sexual activity: Not on file  Other Topics Concern  . Not on file  Social History Narrative  . Not on file   Social Determinants of Health   Financial Resource Strain: Not on file  Food Insecurity: Not on  file  Transportation Needs: Not on file            Allergies  Allergen Reactions  . Penicillins Rash    Reaction as a child    Current Medications        Current Outpatient Medications  Medication Sig Dispense Refill  . triamcinolone 0.1 % cream Apply topically    . triamcinolone acetonide (NASACORT NASAL) Place into one nostril once daily    . fluticasone (FLONASE) 50 mcg/actuation nasal spray Place 2 sprays into both nostrils once daily (Patient not taking: Reported on 04/20/2020  )     No current facility-administered medications for this visit.           Family History  Problem Relation Age of Onset  . High blood pressure (Hypertension) Mother   . Myocardial Infarction (Heart attack) Father 38  . Heart disease Father   . Thyroid disease Sister   . Breast cancer Sister        DCIS  . Thyroid disease Sister   . Colon cancer Neg Hx        Review of Systems  Constitutional: Negative for chills and fever.  Respiratory: Negative for cough.        Objective:   Physical Exam Constitutional:      Appearance: Normal appearance.  Neurological:     Mental Status: She is alert and oriented to  person, place, and time.  Psychiatric:        Mood and Affect: Mood normal.        Behavior: Behavior normal.  Lungs: Clear. Cardio: RR w/o murmur or gallop.  Labs and Radiology:    April 28, 2020 MRI:  IMPRESSION: 1. The patient's known malignancy in the upper outer quadrant of the right breast demonstrates no enhancement on today's MRI. 2. No other MRI evidence of malignancy.  RECOMMENDATION: Recommend continued surgical and oncologic follow up.  BI-RADS CATEGORY 6: Known biopsy-proven malignancy.     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  No associated lesions on MRI.    Plan:     The entire 56 -minute visit was spent reviewing options for management:  1) wide excision, sentinel node biopsy followed by radiation and antiestrogen therapy versus 2) mastectomy without radiation with or without reconstruction versus 3) bilateral simple mastectomy without reconstruction.  The patient reports that she does not want to take radiation, which would make breast conservation less suitable and less likely to provide long-term disease control.  She is interested in postponing therapy until after a June vacation planned.  This is a fairly long period of time for a 2+ centimeter tumor, and I was sure that she could have a mastectomy and expander placement although not final prosthesis placement before surgery and be well enough to enjoy vacation or be well within a few weeks after bilateral simple mastectomy.  The first determination will be whether she is going to delay until June and if that is the case I would certainly want her to begin antiestrogen therapy.  Arrangements we made for a bone density exam in the near future.  She will notify the office if she wishes to meet a) with plastic surgery or b) desires to proceed with surgical intervention or c) initiation of antiestrogen therapy if delayed surgical intervention is  planned.  She is was asked to stop at the mastectomy supply store to look at breast prostheses as this may help with her decision  for or against reconstruction.   We will make arrangement for the patient to have a bone density test done.     Entered by Ledell Noss, CMA, acting as a scribe for Dr. Hervey Ard, MD.   The documentation recorded by the scribe accurately reflects the service I personally performed and the decisions made by me.   Robert Bellow, MD FACS

## 2020-05-20 NOTE — Telephone Encounter (Signed)
Called patient regarding the surgery being coordinated with Dr. Bary Castilla. Patient advised that she called yesterday with some questions and wanted to know if I could answer any of them, since she was still waiting to hear back from our office. I read through the message sent from the front desk staff and explained that Catalina Antigua will go over each of those items in detail at her pre-op. I explained that she will have limited mobility and lifting restrictions for the first several weeks and will likely not be able to pick up her grandchildren, but may be ok if they simply sit on her lap - without her lifting them up. I advised the typical time off work for this surgery is about 2 weeks, but that will be decided by the providers. She would like to return to work after 1 week. I advised the patient to discuss that with [facility] and explain her situation, but to remember we want her to have optimal healing time and that she will be restricted for driving if she's taking pain medications. I also advised her to ask Catalina Antigua about any other restrictions while she is at [employer], such as submerging in ocean water and pools. Patient expressed understanding. I will copy Matt on this information and have advised the CMA's of my discussion.  Surgery has been posed, with all corresponding appointments listed.

## 2020-05-20 NOTE — Telephone Encounter (Signed)
Returned patients call. Under the advise from [facility], she is able to go to the beach 07/18/20. She will not able to lift grandchildren for 6 weeks and will be out of work approximately 2-3 weeks. 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  Advised further questions and details will be dicussed at pre-op appointment.

## 2020-06-05 ENCOUNTER — Other Ambulatory Visit: Payer: Self-pay

## 2020-06-05 ENCOUNTER — Encounter: Payer: Self-pay | Admitting: Surgical

## 2020-06-05 ENCOUNTER — Ambulatory Visit (INDEPENDENT_AMBULATORY_CARE_PROVIDER_SITE_OTHER): Payer: 59 | Admitting: Surgical

## 2020-06-05 VITALS — BP 147/89 | HR 103 | Ht 63.5 in | Wt 105.4 lb

## 2020-06-05 DIAGNOSIS — D0501 Lobular carcinoma in situ of right breast: Secondary | ICD-10-CM

## 2020-06-05 DIAGNOSIS — C50411 Malignant neoplasm of upper-outer quadrant of right female breast: Secondary | ICD-10-CM

## 2020-06-05 MED ORDER — HYDROCODONE-ACETAMINOPHEN 5-325 MG PO TABS: 1.0000 | ORAL_TABLET | Freq: Four times a day (QID) | ORAL | 0 refills | Status: AC | PRN

## 2020-06-05 MED ORDER — ONDANSETRON HCL 4 MG PO TABS: 4.0000 mg | ORAL_TABLET | Freq: Three times a day (TID) | ORAL | 0 refills | Status: DC | PRN

## 2020-06-05 MED ORDER — CIPROFLOXACIN HCL 500 MG PO TABS: 500.0000 mg | ORAL_TABLET | Freq: Two times a day (BID) | ORAL | 0 refills | Status: AC

## 2020-06-05 MED ORDER — DIAZEPAM 2 MG PO TABS: 2.0000 mg | ORAL_TABLET | Freq: Two times a day (BID) | ORAL | 0 refills | Status: DC | PRN

## 2020-06-05 NOTE — Progress Notes (Signed)
Patient ID: Sarah Hurley, female    DOB: February 28, 1955, 65 y.o.   MRN: [id number]  Chief Complaint  Patient presents with  . Pre-op Exam      ICD-10-CM   1. Carcinoma of upper-outer quadrant of female breast, right ([place])  C50.411   2. 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  She presents for preoperative evaluation for upcoming procedure, right simple mastectomy with axillary sentinel lymph node biopsy by Dr. Bary Castilla followed by immediate right breast reconstruction with placement of tissue expander and Flex HD with Dr. Marla Roe, scheduled for 06/17/2020  The patient has not had problems with anesthesia. No history of DVT/PE.  No family history of DVT/PE.  No family or personal history of bleeding or clotting disorders.  Patient is not currently taking any blood thinners.  No history of CVA/MI.   Summary of Previous Visit: Patient underwent partial mastectomy of the right breast in February 2020.  No history of radiation.  She was found to have another malignancy in the upper outer quadrant and has decided on mastectomy.  She is not a smoker and not a diabetic.  Her preoperative bra size is a 34B.   PMH Significant for: MRSA, heart murmur  Patient is scheduled for preadmission testing at [facility] on 06/08/2020 Patient reports she has not had any history of strokes or heart attacks.  She reports no recent issues.  No current fevers, chills, nausea, vomiting, chest pain, shortness of breath.  No dizziness or weakness.  Past Medical History: Allergies: Allergies  Allergen Reactions  . Other Palpitations    Antihistamines  . Penicillins Rash    Did it involve swelling of the face/tongue/throat, SOB, or low BP? No Did it involve sudden or severe rash/hives, skin peeling, or any reaction on the  inside of your mouth or nose? Yes Did you need to seek medical attention at a hospital or doctor's office? Unknown When did it last happen?childhood If all above answers are "NO", may proceed with cephalosporin use.     Current Medications:  Current Outpatient Medications:  .  Lifitegrast (XIIDRA) 5 % SOLN, Place 1 drop into both eyes at bedtime., Disp: , Rfl:  .  Misc Natural Products (ESSIAC TONIC PO), Take 1 Bottle by mouth in the morning and at bedtime., Disp: , Rfl:  .  olopatadine (PATANOL) 0.1 % ophthalmic solution, Place 1 drop into both eyes in the morning., Disp: , Rfl:  .  PAPAYA ENZYME PO, Take 1 tablet by mouth daily as needed (indigestion)., Disp: , Rfl:  .  triamcinolone (NASACORT) 55 MCG/ACT AERO nasal inhaler, Place 2 sprays into the nose daily as needed (allergies)., Disp: , Rfl:  .  triamcinolone cream (KENALOG) 0.1 %, Apply 1 application topically daily as needed (itching)., Disp: , Rfl: 1  Past Medical Problems: Past Medical History:  Diagnosis Date  . Arthritis   . Breast mass, right 12/20/2017   PLEOMORPHIC LOBULAR CARCINOMA IN SITU  . Cancer ([place])    no hx of cancer per pt  . Complication of anesthesia    PT STATES THAT ANESTHESIA HAS ALWAYS USE THE ANESTHESIA THAT IS GIVEN TO HEART PATIENTS  . Fluttering heart   . GERD (gastroesophageal reflux disease)    OCC  . Heart  murmur   . History of methicillin resistant staphylococcus aureus (MRSA)   . PONV (postoperative nausea and vomiting)   . Seasonal allergies   . Thyroid nodule     Past Surgical History: Past Surgical History:  Procedure Laterality Date  . BREAST BIOPSY Right 12/20/2017   stereo bx calcs/x clip/ PLEOMORPHIC LOBULAR CARCINOMA IN SITU  . BREAST BIOPSY Right 04/09/2020   u/s bx path pending 10:00 6cmfn Q clip  . BREAST CYST ASPIRATION Bilateral 2003   neg  . BREAST LUMPECTOMY Right 03/26/2018   LCIS  . BREAST LUMPECTOMY WITH NEEDLE LOCALIZATION Right 03/26/2018   Procedure: RIGHT  BREAST WIDE EXCISION WITH NEEDLE LOCALIZATION;  Surgeon: Robert Bellow, MD;  Location: [facility] ORS;  Service: General;  Laterality: Right;  . COLONOSCOPY  2015  . EYE SURGERY     BIL  . TONSILLECTOMY      Social History: Social History   Socioeconomic History  . Marital status: Married    Spouse name: Not on file  . Number of children: Not on file  . Years of education: Not on file  . Highest education level: Not on file  Occupational History  . Not on file  Tobacco Use  . Smoking status: Never Smoker  . Smokeless tobacco: Never Used  Vaping Use  . Vaping Use: Never used  Substance and Sexual Activity  . Alcohol use: Yes    Comment: couple of drinks/day  . Drug use: No  . Sexual activity: Not on file  Other Topics Concern  . Not on file  Social History Narrative  . Not on file   Social Determinants of Health   Financial Resource Strain: Not on file  Food Insecurity: Not on file  Transportation Needs: Not on file  Physical Activity: Not on file  Stress: Not on file  Social Connections: Not on file  Intimate Partner Violence: Not on file    Family History: Family History  Problem Relation Age of Onset  . Hypertension Mother   . Heart disease Father   . Breast cancer Sister     Review of Systems: Review of Systems  Constitutional: Negative.   Respiratory: Negative.   Cardiovascular: Negative.   Genitourinary: Negative.   Neurological: Negative.     Physical Exam: Vital Signs BP (!) 147/89 (BP Location: Left Arm, Patient Position: Sitting, Cuff Size: Normal)   Pulse (!) 103   Ht 5' 3.5" (1.613 m)   Wt 105 lb 6.4 oz (47.8 kg)   SpO2 97%   BMI 18.38 kg/m   Physical Exam Constitutional:      General: Not in acute distress.    Appearance: Normal appearance. Not ill-appearing.  HENT:     Head: Normocephalic and atraumatic.  Eyes:     Pupils: Pupils are equal, round Neck:     Musculoskeletal: Normal range of motion.  Cardiovascular:     Rate  and Rhythm: Normal rate    Pulses: Normal pulses.  Pulmonary:     Effort: Pulmonary effort is normal. No respiratory distress.  Abdominal:     General: Abdomen is flat. There is no distension.  Musculoskeletal: Normal range of motion.  Skin:    General: Skin is warm and dry.     Findings: No erythema or rash.  Neurological:     General: No focal deficit present.     Mental Status: Alert and oriented to person, place, and time. Mental status is at baseline.     Motor: No weakness.  Psychiatric:        Mood and Affect: Mood normal.        Behavior: Behavior normal.    Assessment/Plan: The patient is scheduled for immediate right breast reconstruction with placement of tissue expander and Flex HD with Dr. Marla Roe.  Risks, benefits, and alternatives of procedure discussed, questions answered and consent obtained.    Smoking Status: Non-smoker; Counseling Given?  N/A  Caprini Score: 6, high; Risk Factors include: Age, current breast cancer, and length of planned surgery. Recommendation for mechanical and pharmacological prophylaxis. Encourage early ambulation.   Pictures obtained: 05/12/2020  Post-op Rx sent to pharmacy: Norco, Zofran, Cipro, Valium  Patient was provided with the breast reconstruction and General Surgical Risk consent document and Pain Medication Agreement prior to their appointment.  They had adequate time to read through the risk consent documents and Pain Medication Agreement. We also discussed them in person together during this preop appointment. All of their questions were answered to their satisfaction.  Recommended calling if they have any further questions.  Risk consent form and Pain Medication Agreement to be scanned into patient's chart.  The risks that can be encountered with and after placement of a breast expander placement were discussed and include the following but not limited to these: bleeding, infection, delayed healing, anesthesia risks, skin  sensation changes, injury to structures including nerves, blood vessels, and muscles which may be temporary or permanent, allergies to tape, suture materials and glues, blood products, topical preparations or injected agents, skin contour irregularities, skin discoloration and swelling, deep vein thrombosis, cardiac and pulmonary complications, pain, which may persist, fluid accumulation, wrinkling of the skin over the expander, changes in nipple or breast sensation, expander leakage or rupture, faulty position of the expander, persistent pain, formation of tight scar tissue around the expander (capsular contracture), possible need for revisional surgery or staged procedures.   Electronically signed by: Carola Rhine Temperance Kelemen, PA-C 06/05/2020 2:29 PM

## 2020-06-05 NOTE — H&P (View-Only) (Signed)
   Patient ID: Sarah Hurley, female    DOB: 06/16/1955, 64 y.o.   MRN: [id number]  Chief Complaint  Patient presents with  . Pre-op Exam      ICD-10-CM   1. 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  She presents for preoperative evaluation for upcoming procedure, right simple mastectomy with axillary sentinel lymph node biopsy by Dr. Byrnett followed by immediate right breast reconstruction with placement of tissue expander and Flex HD with Dr. Dillingham, scheduled for 06/17/2020  The patient has not had problems with anesthesia. No history of DVT/PE.  No family history of DVT/PE.  No family or personal history of bleeding or clotting disorders.  Patient is not currently taking any blood thinners.  No history of CVA/MI.   Summary of Previous Visit: Patient underwent partial mastectomy of the right breast in February 2020.  No history of radiation.  She was found to have another malignancy in the upper outer quadrant and has decided on mastectomy.  She is not a smoker and not a diabetic.  Her preoperative bra size is a 34B.   PMH Significant for: MRSA, heart murmur  Patient is scheduled for preadmission testing at [facility] on 06/08/2020 Patient reports she has not had any history of strokes or heart attacks.  She reports no recent issues.  No current fevers, chills, nausea, vomiting, chest pain, shortness of breath.  No dizziness or weakness.  Past Medical History: Allergies: Allergies  Allergen Reactions  . Other Palpitations    Antihistamines  . Penicillins Rash    Did it involve swelling of the face/tongue/throat, SOB, or low BP? No Did it involve sudden or severe rash/hives, skin peeling, or any reaction on the  inside of your mouth or nose? Yes Did you need to seek medical attention at a hospital or doctor's office? Unknown When did it last happen?childhood If all above answers are "NO", may proceed with cephalosporin use.     Current Medications:  Current Outpatient Medications:  .  Lifitegrast (XIIDRA) 5 % SOLN, Place 1 drop into both eyes at bedtime., Disp: , Rfl:  .  Misc Natural Products (ESSIAC TONIC PO), Take 1 Bottle by mouth in the morning and at bedtime., Disp: , Rfl:  .  olopatadine (PATANOL) 0.1 % ophthalmic solution, Place 1 drop into both eyes in the morning., Disp: , Rfl:  .  PAPAYA ENZYME PO, Take 1 tablet by mouth daily as needed (indigestion)., Disp: , Rfl:  .  triamcinolone (NASACORT) 55 MCG/ACT AERO nasal inhaler, Place 2 sprays into the nose daily as needed (allergies)., Disp: , Rfl:  .  triamcinolone cream (KENALOG) 0.1 %, Apply 1 application topically daily as needed (itching)., Disp: , Rfl: 1  Past Medical Problems: Past Medical History:  Diagnosis Date  . Arthritis   . Breast mass, right 12/20/2017   PLEOMORPHIC LOBULAR CARCINOMA IN SITU  . Cancer (HCC)    no hx of cancer per pt  . Complication of anesthesia    PT STATES THAT ANESTHESIA HAS ALWAYS USE THE ANESTHESIA THAT IS GIVEN TO HEART PATIENTS  . Fluttering heart   . GERD (gastroesophageal reflux disease)    OCC  . Heart   murmur   . History of methicillin resistant staphylococcus aureus (MRSA)   . PONV (postoperative nausea and vomiting)   . Seasonal allergies   . Thyroid nodule     Past Surgical History: Past Surgical History:  Procedure Laterality Date  . BREAST BIOPSY Right 12/20/2017   stereo bx calcs/x clip/ PLEOMORPHIC LOBULAR CARCINOMA IN SITU  . BREAST BIOPSY Right 04/09/2020   u/s bx path pending 10:00 6cmfn Q clip  . BREAST CYST ASPIRATION Bilateral 2003   neg  . BREAST LUMPECTOMY Right 03/26/2018   LCIS  . BREAST LUMPECTOMY WITH NEEDLE LOCALIZATION Right 03/26/2018   Procedure: RIGHT  BREAST WIDE EXCISION WITH NEEDLE LOCALIZATION;  Surgeon: Robert Bellow, MD;  Location: [facility] ORS;  Service: General;  Laterality: Right;  . COLONOSCOPY  2015  . EYE SURGERY     BIL  . TONSILLECTOMY      Social History: Social History   Socioeconomic History  . Marital status: Married    Spouse name: Not on file  . Number of children: Not on file  . Years of education: Not on file  . Highest education level: Not on file  Occupational History  . Not on file  Tobacco Use  . Smoking status: Never Smoker  . Smokeless tobacco: Never Used  Vaping Use  . Vaping Use: Never used  Substance and Sexual Activity  . Alcohol use: Yes    Comment: couple of drinks/day  . Drug use: No  . Sexual activity: Not on file  Other Topics Concern  . Not on file  Social History Narrative  . Not on file   Social Determinants of Health   Financial Resource Strain: Not on file  Food Insecurity: Not on file  Transportation Needs: Not on file  Physical Activity: Not on file  Stress: Not on file  Social Connections: Not on file  Intimate Partner Violence: Not on file    Family History: Family History  Problem Relation Age of Onset  . Hypertension Mother   . Heart disease Father   . Breast cancer Sister     Review of Systems: Review of Systems  Constitutional: Negative.   Respiratory: Negative.   Cardiovascular: Negative.   Genitourinary: Negative.   Neurological: Negative.     Physical Exam: Vital Signs BP (!) 147/89 (BP Location: Left Arm, Patient Position: Sitting, Cuff Size: Normal)   Pulse (!) 103   Ht 5' 3.5" (1.613 m)   Wt 105 lb 6.4 oz (47.8 kg)   SpO2 97%   BMI 18.38 kg/m   Physical Exam Constitutional:      General: Not in acute distress.    Appearance: Normal appearance. Not ill-appearing.  HENT:     Head: Normocephalic and atraumatic.  Eyes:     Pupils: Pupils are equal, round Neck:     Musculoskeletal: Normal range of motion.  Cardiovascular:     Rate  and Rhythm: Normal rate    Pulses: Normal pulses.  Pulmonary:     Effort: Pulmonary effort is normal. No respiratory distress.  Abdominal:     General: Abdomen is flat. There is no distension.  Musculoskeletal: Normal range of motion.  Skin:    General: Skin is warm and dry.     Findings: No erythema or rash.  Neurological:     General: No focal deficit present.     Mental Status: Alert and oriented to person, place, and time. Mental status is at baseline.     Motor: No weakness.  Psychiatric:        Mood and Affect: Mood normal.        Behavior: Behavior normal.    Assessment/Plan: The patient is scheduled for immediate right breast reconstruction with placement of tissue expander and Flex HD with Dr. Dillingham.  Risks, benefits, and alternatives of procedure discussed, questions answered and consent obtained.    Smoking Status: Non-smoker; Counseling Given?  N/A  Caprini Score: 6, high; Risk Factors include: Age, current breast cancer, and length of planned surgery. Recommendation for mechanical and pharmacological prophylaxis. Encourage early ambulation.   Pictures obtained: 05/12/2020  Post-op Rx sent to pharmacy: Norco, Zofran, Cipro, Valium  Patient was provided with the breast reconstruction and General Surgical Risk consent document and Pain Medication Agreement prior to their appointment.  They had adequate time to read through the risk consent documents and Pain Medication Agreement. We also discussed them in person together during this preop appointment. All of their questions were answered to their satisfaction.  Recommended calling if they have any further questions.  Risk consent form and Pain Medication Agreement to be scanned into patient's chart.  The risks that can be encountered with and after placement of a breast expander placement were discussed and include the following but not limited to these: bleeding, infection, delayed healing, anesthesia risks, skin  sensation changes, injury to structures including nerves, blood vessels, and muscles which may be temporary or permanent, allergies to tape, suture materials and glues, blood products, topical preparations or injected agents, skin contour irregularities, skin discoloration and swelling, deep vein thrombosis, cardiac and pulmonary complications, pain, which may persist, fluid accumulation, wrinkling of the skin over the expander, changes in nipple or breast sensation, expander leakage or rupture, faulty position of the expander, persistent pain, formation of tight scar tissue around the expander (capsular contracture), possible need for revisional surgery or staged procedures.   Electronically signed by: Journii Nierman J Jaz Laningham, PA-C 06/05/2020 2:29 PM 

## 2020-06-08 ENCOUNTER — Other Ambulatory Visit: Payer: Self-pay

## 2020-06-08 ENCOUNTER — Encounter
Admission: RE | Admit: 2020-06-08 | Discharge: 2020-06-08 | Disposition: A | Discharge status: Home / Self Care | Payer: 59 | Source: Ambulatory Visit | Attending: General Surgery | Admitting: General Surgery

## 2020-06-08 NOTE — Patient Instructions (Addendum)
Your procedure is scheduled on: 06/17/20 Report to  [facility] at previously scheduled time (11:00).  You may also call below for any questions To find out your arrival time please call [phone] between 1PM - 3PM on 06/16/20.  Remember: Instructions that are not followed completely may result in serious medical risk, up to and including death, or upon the discretion of your surgeon and anesthesiologist your surgery may need to be rescheduled.     _X__ 1. Do not eat food after midnight the night before your procedure.                 No gum chewing or hard candies. You may drink clear liquids up to 2 hours                 before you are scheduled to arrive for your surgery- DO not drink clear                 liquids within 2 hours of the start of your surgery.                 Clear Liquids include:  water, apple juice without pulp, clear carbohydrate                 drink such as Clearfast or Gatorade, Black Coffee or Tea (Do not add                 anything to coffee or tea). Diabetics water only  __X__2.  On the morning of surgery brush your teeth with toothpaste and water, you                 may rinse your mouth with mouthwash if you wish.  Do not swallow any              toothpaste of mouthwash.     _X__ 3.  No Alcohol for 24 hours before or after surgery.   _X__ 4.  Do Not Smoke or use e-cigarettes For 24 Hours Prior to Your Surgery.                 Do not use any chewable tobacco products for at least 6 hours prior to                 surgery.  ____  5.  Bring all medications with you on the day of surgery if instructed.   __X__  6.  Notify your doctor if there is any change in your medical condition      (cold, fever, infections).     Do not wear jewelry, make-up, hairpins, clips or nail polish. Do not wear lotions, powders, or perfumes.  Do not shave 48 hours prior to surgery. Men may shave face and neck. Do not bring valuables to the hospital.    [facility] is  not responsible for any belongings or valuables.  Contacts, dentures/partials or body piercings may not be worn into surgery. Bring a case for your contacts, glasses or hearing aids, a denture cup will be supplied. Leave your suitcase in the car. After surgery it may be brought to your room. For patients admitted to the hospital, discharge time is determined by your treatment team.   Patients discharged the day of surgery will not be allowed to drive home.   Please read over the following fact sheets that you were given:   MRSA Information, CHG soap  __X__ Take these medicines the  morning of surgery with A SIP OF WATER:    1. none  2.   3.   4.  5.  6.  ____ Fleet Enema (as directed)   __X__ Use CHG Soap/SAGE wipes as directed  ____ Use inhalers on the day of surgery  ____ Stop metformin/Janumet/Farxiga 2 days prior to surgery    ____ Take 1/2 of usual insulin dose the night before surgery. No insulin the morning          of surgery.   ____ Stop Blood Thinners Coumadin/Plavix/Xarelto/Pleta/Pradaxa/Eliquis/Effient/Aspirin  on   Or contact your Surgeon, Cardiologist or Medical Doctor regarding  ability to stop your blood thinners  __X__ Stop Anti-inflammatories 7 days before surgery such as Advil, Ibuprofen, Motrin,  BC or Goodies Powder, Naprosyn, Naproxen, Aleve, Aspirin    __X__ Stop all herbal supplements, fish oil or vitamin E until after surgery.    ____ Bring C-Pap to the hospital.

## 2020-06-09 ENCOUNTER — Other Ambulatory Visit
Admission: RE | Admit: 2020-06-09 | Discharge: 2020-06-09 | Disposition: A | Discharge status: Home / Self Care | Payer: 59 | Source: Ambulatory Visit | Attending: General Surgery | Admitting: General Surgery

## 2020-06-09 DIAGNOSIS — Z01812 Encounter for preprocedural laboratory examination: Secondary | ICD-10-CM | POA: Diagnosis present

## 2020-06-09 DIAGNOSIS — Z01810 Encounter for preprocedural cardiovascular examination: Secondary | ICD-10-CM | POA: Diagnosis not present

## 2020-06-10 ENCOUNTER — Encounter: Payer: Self-pay | Admitting: General Surgery

## 2020-06-10 ENCOUNTER — Telehealth: Payer: Self-pay

## 2020-06-10 NOTE — Telephone Encounter (Signed)
Patient would like to know if it is necessary for her to be prescribed nausea medication for after surgery. She states she has not needed this in the past and the cost is $30.

## 2020-06-10 NOTE — Telephone Encounter (Signed)
Returned patients call. LMVM that she does not have to pick up the Zofran for nausea if she has never gotten sick after surgery in the past. We prescribe it for convenience to have on hand just in case patients do get sick.

## 2020-06-15 ENCOUNTER — Other Ambulatory Visit: Payer: 59

## 2020-06-15 ENCOUNTER — Ambulatory Visit: Payer: 59

## 2020-06-17 ENCOUNTER — Ambulatory Visit: Payer: 59 | Admitting: Urgent Care

## 2020-06-17 ENCOUNTER — Encounter: Payer: Self-pay | Admitting: General Surgery

## 2020-06-17 ENCOUNTER — Encounter
Admission: RE | Disposition: A | Discharge status: Home / Self Care | Payer: Self-pay | Source: Home / Self Care | Attending: General Surgery

## 2020-06-17 ENCOUNTER — Ambulatory Visit
Admission: RE | Admit: 2020-06-17 | Discharge: 2020-06-17 | Disposition: A | Discharge status: Home / Self Care | Payer: 59 | Source: Ambulatory Visit | Attending: General Surgery | Admitting: General Surgery

## 2020-06-17 ENCOUNTER — Other Ambulatory Visit: Payer: Self-pay

## 2020-06-17 ENCOUNTER — Ambulatory Visit
Admission: RE | Admit: 2020-06-17 | Discharge: 2020-06-17 | Disposition: A | Discharge status: Home / Self Care | Payer: 59 | Attending: General Surgery | Admitting: General Surgery

## 2020-06-17 DIAGNOSIS — C50411 Malignant neoplasm of upper-outer quadrant of right female breast: Secondary | ICD-10-CM | POA: Insufficient documentation

## 2020-06-17 DIAGNOSIS — C773 Secondary and unspecified malignant neoplasm of axilla and upper limb lymph nodes: Secondary | ICD-10-CM | POA: Diagnosis not present

## 2020-06-17 DIAGNOSIS — Z79899 Other long term (current) drug therapy: Secondary | ICD-10-CM | POA: Insufficient documentation

## 2020-06-17 DIAGNOSIS — Z880 Allergy status to penicillin: Secondary | ICD-10-CM | POA: Insufficient documentation

## 2020-06-17 DIAGNOSIS — D0501 Lobular carcinoma in situ of right breast: Secondary | ICD-10-CM | POA: Diagnosis not present

## 2020-06-17 DIAGNOSIS — Z888 Allergy status to other drugs, medicaments and biological substances status: Secondary | ICD-10-CM | POA: Diagnosis not present

## 2020-06-17 HISTORY — PX: BREAST RECONSTRUCTION WITH PLACEMENT OF TISSUE EXPANDER AND FLEX HD (ACELLULAR HYDRATED DERMIS): SHX6295

## 2020-06-17 HISTORY — PX: OTHER SURGICAL HISTORY: SHX169

## 2020-06-17 HISTORY — PX: SIMPLE MASTECTOMY WITH AXILLARY SENTINEL NODE BIOPSY: SHX6098

## 2020-06-17 SURGERY — SIMPLE MASTECTOMY WITH AXILLARY SENTINEL NODE BIOPSY
Anesthesia: General | Laterality: Right

## 2020-06-17 MED ORDER — DEXMEDETOMIDINE HCL 200 MCG/2ML IV SOLN
INTRAVENOUS | Status: DC | PRN
  Administered 2020-06-17 (×3): 8 ug via INTRAVENOUS
  Administered 2020-06-17: 4 ug via INTRAVENOUS
  Administered 2020-06-17: 8 ug via INTRAVENOUS

## 2020-06-17 MED ORDER — PROPOFOL 1000 MG/100ML IV EMUL
INTRAVENOUS | Status: AC
  Filled 2020-06-17: qty 100

## 2020-06-17 MED ORDER — GABAPENTIN 300 MG PO CAPS
ORAL_CAPSULE | ORAL | Status: AC
  Administered 2020-06-17: 300 mg via ORAL
  Filled 2020-06-17: qty 1

## 2020-06-17 MED ORDER — ACETAMINOPHEN 650 MG RE SUPP: 650.0000 mg | RECTAL | Status: DC | PRN

## 2020-06-17 MED ORDER — MIDAZOLAM HCL 2 MG/2ML IJ SOLN
INTRAMUSCULAR | Status: DC | PRN
  Administered 2020-06-17: 2 mg via INTRAVENOUS

## 2020-06-17 MED ORDER — CEFAZOLIN SODIUM-DEXTROSE 2-4 GM/100ML-% IV SOLN
2.0000 g | INTRAVENOUS | Status: AC
  Administered 2020-06-17: 2 g via INTRAVENOUS

## 2020-06-17 MED ORDER — OXYCODONE HCL 5 MG PO TABS
ORAL_TABLET | ORAL | Status: AC
  Filled 2020-06-17: qty 1

## 2020-06-17 MED ORDER — PROMETHAZINE HCL 25 MG/ML IJ SOLN: 6.2500 mg | INTRAMUSCULAR | Status: DC | PRN

## 2020-06-17 MED ORDER — FAMOTIDINE 20 MG PO TABS: 20.0000 mg | ORAL_TABLET | Freq: Once | ORAL | Status: AC

## 2020-06-17 MED ORDER — FENTANYL CITRATE (PF) 100 MCG/2ML IJ SOLN
INTRAMUSCULAR | Status: AC
  Filled 2020-06-17: qty 2

## 2020-06-17 MED ORDER — SODIUM CHLORIDE (PF) 0.9 % IJ SOLN
INTRAMUSCULAR | Status: AC
  Filled 2020-06-17: qty 10

## 2020-06-17 MED ORDER — FAMOTIDINE 20 MG PO TABS
ORAL_TABLET | ORAL | Status: AC
  Administered 2020-06-17: 20 mg via ORAL
  Filled 2020-06-17: qty 1

## 2020-06-17 MED ORDER — SODIUM CHLORIDE 0.9% FLUSH: 3.0000 mL | Freq: Two times a day (BID) | INTRAVENOUS | Status: DC

## 2020-06-17 MED ORDER — BUPIVACAINE-EPINEPHRINE 0.25% -1:200000 IJ SOLN
INTRAMUSCULAR | Status: DC | PRN
  Administered 2020-06-17: 20 mL

## 2020-06-17 MED ORDER — PROPOFOL 10 MG/ML IV BOLUS
INTRAVENOUS | Status: DC | PRN
  Administered 2020-06-17: 30 mg via INTRAVENOUS
  Administered 2020-06-17: 120 mg via INTRAVENOUS
  Administered 2020-06-17: 20 mg via INTRAVENOUS

## 2020-06-17 MED ORDER — SUGAMMADEX SODIUM 200 MG/2ML IV SOLN
INTRAVENOUS | Status: DC | PRN
  Administered 2020-06-17: 200 mg via INTRAVENOUS

## 2020-06-17 MED ORDER — ORAL CARE MOUTH RINSE: 15.0000 mL | Freq: Once | OROMUCOSAL | Status: AC

## 2020-06-17 MED ORDER — SODIUM CHLORIDE 0.9 % IV SOLN: 250.0000 mL | INTRAVENOUS | Status: DC | PRN

## 2020-06-17 MED ORDER — DEXAMETHASONE SODIUM PHOSPHATE 10 MG/ML IJ SOLN
INTRAMUSCULAR | Status: DC | PRN
  Administered 2020-06-17: 6 mg via INTRAVENOUS

## 2020-06-17 MED ORDER — ONDANSETRON HCL 4 MG/2ML IJ SOLN
INTRAMUSCULAR | Status: DC | PRN
  Administered 2020-06-17: 4 mg via INTRAVENOUS

## 2020-06-17 MED ORDER — GABAPENTIN 300 MG PO CAPS: 300.0000 mg | ORAL_CAPSULE | ORAL | Status: AC

## 2020-06-17 MED ORDER — PROMETHAZINE HCL 25 MG/ML IJ SOLN
INTRAMUSCULAR | Status: AC
  Filled 2020-06-17: qty 1

## 2020-06-17 MED ORDER — CEFAZOLIN SODIUM-DEXTROSE 2-4 GM/100ML-% IV SOLN
INTRAVENOUS | Status: AC
  Filled 2020-06-17: qty 100

## 2020-06-17 MED ORDER — CHLORHEXIDINE GLUCONATE CLOTH 2 % EX PADS: 6.0000 | MEDICATED_PAD | Freq: Once | CUTANEOUS | Status: DC

## 2020-06-17 MED ORDER — OXYCODONE HCL 5 MG PO TABS
5.0000 mg | ORAL_TABLET | ORAL | Status: DC | PRN
  Administered 2020-06-17: 5 mg via ORAL

## 2020-06-17 MED ORDER — ACETAMINOPHEN 10 MG/ML IV SOLN
INTRAVENOUS | Status: DC | PRN
  Administered 2020-06-17: 1000 mg via INTRAVENOUS

## 2020-06-17 MED ORDER — FENTANYL CITRATE (PF) 100 MCG/2ML IJ SOLN: 25.0000 ug | INTRAMUSCULAR | Status: DC | PRN

## 2020-06-17 MED ORDER — ROCURONIUM BROMIDE 100 MG/10ML IV SOLN
INTRAVENOUS | Status: DC | PRN
  Administered 2020-06-17: 50 mg via INTRAVENOUS
  Administered 2020-06-17: 30 mg via INTRAVENOUS
  Administered 2020-06-17: 20 mg via INTRAVENOUS

## 2020-06-17 MED ORDER — TECHNETIUM TC 99M TILMANOCEPT KIT
1.0000 | PACK | Freq: Once | INTRAVENOUS | Status: AC | PRN
  Administered 2020-06-17: 1.07 via INTRADERMAL

## 2020-06-17 MED ORDER — LACTATED RINGERS IV SOLN: INTRAVENOUS | Status: DC

## 2020-06-17 MED ORDER — PROPOFOL 500 MG/50ML IV EMUL
INTRAVENOUS | Status: DC | PRN
  Administered 2020-06-17: 150 ug/kg/min via INTRAVENOUS

## 2020-06-17 MED ORDER — ACETAMINOPHEN 325 MG PO TABS: 650.0000 mg | ORAL_TABLET | ORAL | Status: DC | PRN

## 2020-06-17 MED ORDER — DEXAMETHASONE SODIUM PHOSPHATE 10 MG/ML IJ SOLN
INTRAMUSCULAR | Status: AC
  Filled 2020-06-17: qty 1

## 2020-06-17 MED ORDER — FENTANYL CITRATE (PF) 100 MCG/2ML IJ SOLN
INTRAMUSCULAR | Status: DC | PRN
  Administered 2020-06-17: 50 ug via INTRAVENOUS
  Administered 2020-06-17 (×2): 25 ug via INTRAVENOUS

## 2020-06-17 MED ORDER — ROCURONIUM BROMIDE 10 MG/ML (PF) SYRINGE
PREFILLED_SYRINGE | INTRAVENOUS | Status: AC
  Filled 2020-06-17: qty 10

## 2020-06-17 MED ORDER — LIDOCAINE HCL (CARDIAC) PF 100 MG/5ML IV SOSY
PREFILLED_SYRINGE | INTRAVENOUS | Status: DC | PRN
  Administered 2020-06-17: 60 mg via INTRAVENOUS
  Administered 2020-06-17: 40 mg via INTRAVENOUS

## 2020-06-17 MED ORDER — PROPOFOL 10 MG/ML IV BOLUS
INTRAVENOUS | Status: AC
  Filled 2020-06-17: qty 40

## 2020-06-17 MED ORDER — LIDOCAINE HCL (PF) 2 % IJ SOLN
INTRAMUSCULAR | Status: AC
  Filled 2020-06-17: qty 5

## 2020-06-17 MED ORDER — CEFAZOLIN SODIUM-DEXTROSE 2-4 GM/100ML-% IV SOLN: 2.0000 g | INTRAVENOUS | Status: DC

## 2020-06-17 MED ORDER — CHLORHEXIDINE GLUCONATE 0.12 % MT SOLN
OROMUCOSAL | Status: AC
  Administered 2020-06-17: 15 mL via OROMUCOSAL
  Filled 2020-06-17: qty 15

## 2020-06-17 MED ORDER — SODIUM CHLORIDE 0.9% FLUSH: 3.0000 mL | INTRAVENOUS | Status: DC | PRN

## 2020-06-17 MED ORDER — KETAMINE HCL 10 MG/ML IJ SOLN
INTRAMUSCULAR | Status: DC | PRN
  Administered 2020-06-17 (×3): 10 mg via INTRAVENOUS
  Administered 2020-06-17: 20 mg via INTRAVENOUS

## 2020-06-17 MED ORDER — ACETAMINOPHEN 10 MG/ML IV SOLN
INTRAVENOUS | Status: AC
  Filled 2020-06-17: qty 100

## 2020-06-17 MED ORDER — MIDAZOLAM HCL 2 MG/2ML IJ SOLN
INTRAMUSCULAR | Status: AC
  Filled 2020-06-17: qty 2

## 2020-06-17 MED ORDER — CHLORHEXIDINE GLUCONATE 0.12 % MT SOLN: 15.0000 mL | Freq: Once | OROMUCOSAL | Status: AC

## 2020-06-17 MED ORDER — MORPHINE SULFATE (PF) 4 MG/ML IV SOLN
INTRAVENOUS | Status: AC
  Administered 2020-06-17: 2 mg
  Filled 2020-06-17: qty 1

## 2020-06-17 MED ORDER — FENTANYL CITRATE (PF) 100 MCG/2ML IJ SOLN
25.0000 ug | INTRAMUSCULAR | Status: DC | PRN
  Administered 2020-06-17: 25 ug via INTRAVENOUS

## 2020-06-17 SURGICAL SUPPLY — 103 items
ADH SKN CLS APL DERMABOND .7 (GAUZE/BANDAGES/DRESSINGS) ×2
APL PRP STRL LF DISP 70% ISPRP (MISCELLANEOUS) ×1
APPLIER CLIP 11 MED OPEN (CLIP)
APPLIER CLIP 13 LRG OPEN (CLIP)
APR CLP LRG 13 20 CLIP (CLIP)
APR CLP MED 11 20 MLT OPN (CLIP)
BAG DECANTER FOR FLEXI CONT (MISCELLANEOUS) ×2 IMPLANT
BINDER BREAST LRG (GAUZE/BANDAGES/DRESSINGS) IMPLANT
BINDER BREAST MEDIUM (GAUZE/BANDAGES/DRESSINGS) IMPLANT
BINDER BREAST XLRG (GAUZE/BANDAGES/DRESSINGS) IMPLANT
BINDER BREAST XXLRG (GAUZE/BANDAGES/DRESSINGS) IMPLANT
BIOPATCH WHT 1IN DISK W/4.0 H (GAUZE/BANDAGES/DRESSINGS) ×2 IMPLANT
BLADE BOVIE TIP EXT 4 (BLADE) ×2 IMPLANT
BLADE PHOTON ILLUMINATED (MISCELLANEOUS) ×2 IMPLANT
BLADE SURG 15 STRL LF DISP TIS (BLADE) ×1 IMPLANT
BLADE SURG 15 STRL SS (BLADE) ×2
BLADE SURG 15 STRL SS SAFETY (BLADE) ×2 IMPLANT
BNDG GAUZE 4.5X4.1 6PLY STRL (MISCELLANEOUS) ×4 IMPLANT
BULB RESERV EVAC DRAIN JP 100C (MISCELLANEOUS) ×2 IMPLANT
CANISTER SUCT 1200ML W/VALVE (MISCELLANEOUS) ×4 IMPLANT
CHLORAPREP W/TINT 26 (MISCELLANEOUS) ×2 IMPLANT
CLIP APPLIE 11 MED OPEN (CLIP) IMPLANT
CLIP APPLIE 13 LRG OPEN (CLIP) IMPLANT
CNTNR SPEC 2.5X3XGRAD LEK (MISCELLANEOUS) ×3
CONT SPEC 4OZ STER OR WHT (MISCELLANEOUS) ×3
CONT SPEC 4OZ STRL OR WHT (MISCELLANEOUS) ×3
CONTAINER SPEC 2.5X3XGRAD LEK (MISCELLANEOUS) ×3 IMPLANT
COVER PROBE FLX POLY STRL (MISCELLANEOUS) ×2 IMPLANT
COVER WAND RF STERILE (DRAPES) ×2 IMPLANT
DECANTER SPIKE VIAL GLASS SM (MISCELLANEOUS) IMPLANT
DERMABOND ADVANCED (GAUZE/BANDAGES/DRESSINGS) ×2
DERMABOND ADVANCED .7 DNX12 (GAUZE/BANDAGES/DRESSINGS) ×2 IMPLANT
DEVICE DETECTOR MAGNETIC BRST (MISCELLANEOUS) ×2 IMPLANT
DRAIN CHANNEL JP 15F RND 16 (MISCELLANEOUS) IMPLANT
DRAIN CHANNEL JP 19F (MISCELLANEOUS) ×2 IMPLANT
DRAPE LAPAROTOMY TRNSV 106X77 (MISCELLANEOUS) ×2 IMPLANT
DRSG GAUZE FLUFF 36X18 (GAUZE/BANDAGES/DRESSINGS) ×2 IMPLANT
DRSG OPSITE POSTOP 4X6 (GAUZE/BANDAGES/DRESSINGS) ×2 IMPLANT
DRSG TEGADERM 2-3/8X2-3/4 SM (GAUZE/BANDAGES/DRESSINGS) ×2 IMPLANT
DRSG TELFA 3X8 NADH (GAUZE/BANDAGES/DRESSINGS) ×2 IMPLANT
ELECT CAUTERY BLADE 6.4 (BLADE) ×2 IMPLANT
ELECT CAUTERY BLADE TIP 2.5 (TIP) ×4
ELECT REM PT RETURN 9FT ADLT (ELECTROSURGICAL) ×2
ELECTRODE CAUTERY BLDE TIP 2.5 (TIP) ×2 IMPLANT
ELECTRODE REM PT RTRN 9FT ADLT (ELECTROSURGICAL) ×1 IMPLANT
GLOVE SURG ENC MOIS LTX SZ6.5 (GLOVE) ×8 IMPLANT
GLOVE SURG ENC MOIS LTX SZ7.5 (GLOVE) ×2 IMPLANT
GLOVE SURG UNDER LTX SZ8 (GLOVE) ×2 IMPLANT
GOWN STRL REUS W/ TWL LRG LVL3 (GOWN DISPOSABLE) ×5 IMPLANT
GOWN STRL REUS W/TWL LRG LVL3 (GOWN DISPOSABLE) ×10
GRAFT FLEX HD 6X16 PLIABLE (Tissue) ×2 IMPLANT
IMPL BREAST 300CC (Breast) ×1 IMPLANT
IMPLANT BREAST 300CC (Breast) ×2 IMPLANT
IV NS 1000ML (IV SOLUTION)
IV NS 1000ML BAXH (IV SOLUTION) IMPLANT
IV NS 500ML (IV SOLUTION)
IV NS 500ML BAXH (IV SOLUTION) IMPLANT
KIT FILL ASEPTIC TRANSFER (MISCELLANEOUS) ×2 IMPLANT
LABEL OR SOLS (LABEL) ×2 IMPLANT
MANIFOLD NEPTUNE II (INSTRUMENTS) ×4 IMPLANT
NEEDLE 21 GA WING INFUSION (NEEDLE) ×2 IMPLANT
NEEDLE FILTER BLUNT 18X 1/2SAF (NEEDLE) ×2
NEEDLE FILTER BLUNT 18X1 1/2 (NEEDLE) ×2 IMPLANT
NEEDLE HYPO 22GX1.5 SAFETY (NEEDLE) IMPLANT
PACK BASIN MAJOR ARMC (MISCELLANEOUS) ×2 IMPLANT
PACK BASIN MINOR ARMC (MISCELLANEOUS) ×2 IMPLANT
PACK UNIVERSAL (MISCELLANEOUS) IMPLANT
PAD ABD DERMACEA PRESS 5X9 (GAUZE/BANDAGES/DRESSINGS) ×2 IMPLANT
PIN SAFETY STRL (MISCELLANEOUS) ×2 IMPLANT
RETRACTOR RING XSMALL (MISCELLANEOUS) IMPLANT
RTRCTR WOUND ALEXIS 13CM XS SH (MISCELLANEOUS)
SHEARS FOC LG CVD HARMONIC 17C (MISCELLANEOUS) IMPLANT
SLEVE PROBE SENORX GAMMA FIND (MISCELLANEOUS) ×2 IMPLANT
SOL PREP PVP 2OZ (MISCELLANEOUS) ×4
SOLUTION PREP PVP 2OZ (MISCELLANEOUS) ×2 IMPLANT
SPONGE LAP 18X18 RF (DISPOSABLE) ×8 IMPLANT
STRIP CLOSURE SKIN 1/2X4 (GAUZE/BANDAGES/DRESSINGS) ×2 IMPLANT
SUT ETHILON 3-0 FS-10 30 BLK (SUTURE) ×4
SUT MNCRL 3-0 UNDYED SH (SUTURE) ×2 IMPLANT
SUT MNCRL 4-0 (SUTURE) ×4
SUT MNCRL 4-0 27XMFL (SUTURE) ×2
SUT MNCRL+ 5-0 UNDYED PC-3 (SUTURE) ×2 IMPLANT
SUT MONOCRYL 3-0 UNDYED (SUTURE) ×2
SUT MONOCRYL 5-0 (SUTURE) ×2
SUT PDS PLUS 2 (SUTURE) ×3
SUT PDS PLUS AB 2-0 CT-1 (SUTURE) ×3 IMPLANT
SUT SILK 2 0 (SUTURE) ×2
SUT SILK 2-0 30XBRD TIE 12 (SUTURE) ×1 IMPLANT
SUT SILK 3 0 (SUTURE) ×2
SUT SILK 3-0 18XBRD TIE 12 (SUTURE) ×1 IMPLANT
SUT SILK 4 0 SH (SUTURE) ×4 IMPLANT
SUT VIC AB 2-0 CT1 27 (SUTURE) ×8
SUT VIC AB 2-0 CT1 TAPERPNT 27 (SUTURE) ×4 IMPLANT
SUT VIC AB 3-0 SH 27 (SUTURE) ×2
SUT VIC AB 3-0 SH 27X BRD (SUTURE) ×1 IMPLANT
SUT VICRYL+ 3-0 144IN (SUTURE) ×2 IMPLANT
SUTURE EHLN 3-0 FS-10 30 BLK (SUTURE) ×2 IMPLANT
SUTURE MNCRL 4-0 27XMF (SUTURE) ×2 IMPLANT
SWABSTK COMLB BENZOIN TINCTURE (MISCELLANEOUS) ×2 IMPLANT
SYR 10ML LL (SYRINGE) ×4 IMPLANT
SYR BULB IRRIG 60ML STRL (SYRINGE) ×2 IMPLANT
TAPE TRANSPORE STRL 2 31045 (GAUZE/BANDAGES/DRESSINGS) ×2 IMPLANT
TOWEL OR 17X26 4PK STRL BLUE (TOWEL DISPOSABLE) ×2 IMPLANT

## 2020-06-17 NOTE — H&P (Signed)
Sarah Hurley [id number] 09-Jul-1955     HPI:  65 y/o woman with a right upper outer quadrant breast cancer.  For simple mastectomy, SLN and expander implantation.   Medications Prior to Admission  Medication Sig Dispense Refill Last Dose  . Lifitegrast (XIIDRA) 5 % SOLN Place 1 drop into both eyes at bedtime. (Patient not taking: Reported on 06/08/2020)     . Misc Natural Products (ESSIAC TONIC PO) Take 1 Bottle by mouth in the morning and at bedtime.     Marland Kitchen olopatadine (PATANOL) 0.1 % ophthalmic solution Place 1 drop into both eyes in the morning.   06/16/2020 at Unknown time  . PAPAYA ENZYME PO Take 1 tablet by mouth daily as needed (indigestion).   06/15/2020  . triamcinolone (NASACORT) 55 MCG/ACT AERO nasal inhaler Place 2 sprays into the nose daily as needed (allergies).     . triamcinolone cream (KENALOG) 0.1 % Apply 1 application topically daily as needed (itching).  1   . diazepam (VALIUM) 2 MG tablet Take 1 tablet (2 mg total) by mouth every 12 (twelve) hours as needed for muscle spasms. 20 tablet 0   . ondansetron (ZOFRAN) 4 MG tablet Take 1 tablet (4 mg total) by mouth every 8 (eight) hours as needed for nausea or vomiting. 20 tablet 0    Allergies  Allergen Reactions  . Other Palpitations    Antihistamines  . Penicillins Rash    Did it involve swelling of the face/tongue/throat, SOB, or low BP? No Did it involve sudden or severe rash/hives, skin peeling, or any reaction on the inside of your mouth or nose? Yes Did you need to seek medical attention at a hospital or doctor's office? Unknown When did it last happen?childhood If all above answers are "NO", may proceed with cephalosporin use.    Past Medical History:  Diagnosis Date  . Arthritis   . Breast mass, right 12/20/2017   PLEOMORPHIC LOBULAR CARCINOMA IN SITU  . Cancer ([name])    no hx of cancer per pt  . Complication of anesthesia    PT STATES THAT ANESTHESIA HAS ALWAYS USE THE ANESTHESIA THAT IS GIVEN TO HEART  PATIENTS  . Fluttering heart   . GERD (gastroesophageal reflux disease)    OCC  . Heart murmur   . History of methicillin resistant staphylococcus aureus (MRSA)   . PONV (postoperative nausea and vomiting)    patient denies PONV 06/08/20  . Seasonal allergies   . Thyroid nodule    Past Surgical History:  Procedure Laterality Date  . BREAST BIOPSY Right 12/20/2017   stereo bx calcs/x clip/ PLEOMORPHIC LOBULAR CARCINOMA IN SITU  . BREAST BIOPSY Right 04/09/2020   u/s bx path pending 10:00 6cmfn Q clip  . BREAST CYST ASPIRATION Bilateral 2003   neg  . BREAST LUMPECTOMY Right 03/26/2018   LCIS  . BREAST LUMPECTOMY WITH NEEDLE LOCALIZATION Right 03/26/2018   Procedure: RIGHT BREAST WIDE EXCISION WITH NEEDLE LOCALIZATION;  Surgeon: Robert Bellow, MD;  Location: [facility] ORS;  Service: General;  Laterality: Right;  . COLONOSCOPY  2015  . EYE SURGERY     BIL  . TONSILLECTOMY     Social History   Socioeconomic History  . Marital status: Married    Spouse name: Not on file  . Number of children: Not on file  . Years of education: Not on file  . Highest education level: Not on file  Occupational History  . Not on file  Tobacco Use  .  Smoking status: Never Smoker  . Smokeless tobacco: Never Used  Vaping Use  . Vaping Use: Never used  Substance and Sexual Activity  . Alcohol use: Yes    Alcohol/week: 14.0 standard drinks    Types: 14 Shots of liquor per week    Comment: couple of drinks/day  . Drug use: No  . Sexual activity: Not on file  Other Topics Concern  . Not on file  Social History Narrative  . Not on file   Social Determinants of Health   Financial Resource Strain: Not on file  Food Insecurity: Not on file  Transportation Needs: Not on file  Physical Activity: Not on file  Stress: Not on file  Social Connections: Not on file  Intimate Partner Violence: Not on file   Social History   Social History Narrative  . Not on file     ROS: Negative.      PE: HEENT: Negative. Lungs: Clear. Cardio: RR.  Assessment/Plan:  Proceed with planned right mastectomy, SLN and expander placement.   [name] [facility] 06/17/2020

## 2020-06-17 NOTE — Transfer of Care (Signed)
Immediate Anesthesia Transfer of Care Note  Patient: Sarah Hurley  Procedure(s) Performed: SIMPLE MASTECTOMY WITH AXILLARY SENTINEL NODE BIOPSY (Right ) IMMEDIATE RIGHT BREAST RECONSTRUCTION WITH PLACEMENT OF TISSUE EXPANDER AND FLEX HD (ACELLULAR HYDRATED DERMIS) (Right )  Patient Location: PACU  Anesthesia Type:General  Level of Consciousness: drowsy  Airway & Oxygen Therapy: Patient Spontanous Breathing and Patient connected to face mask oxygen  Post-op Assessment: Report given to RN and Post -op Vital signs reviewed and stable  Post vital signs: Reviewed and stable  Last Vitals:  Vitals Value Taken Time  BP 111/63 06/17/20 1545  Temp    Pulse 64 06/17/20 1549  Resp 16 06/17/20 1549  SpO2 99 % 06/17/20 1549  Vitals shown include unvalidated device data.  Last Pain:  Vitals:   06/17/20 1154  TempSrc: Oral  PainSc: 0-No pain         Complications: No complications documented.

## 2020-06-17 NOTE — Op Note (Signed)
Op report    DATE OF OPERATION:  06/17/2020  LOCATION: [facility]  SURGICAL DIVISION: Plastic Surgery  PREOPERATIVE DIAGNOSES:  1. Right Breast cancer.    POSTOPERATIVE DIAGNOSES:  1. Right Breast cancer.   PROCEDURE:  1. Right immediate breast reconstruction with placement of Acellular Dermal Matrix and tissue expanders.  SURGEON: Nai Dasch Sanger Traven Davids, DO  ANESTHESIA:  General.   COMPLICATIONS: None.   IMPLANTS: Right - Mentor 300 cc, 150 cc of injectable saline placed in the expander. Acellular Dermal Matrix 6 x 16 cm Flex HD  INDICATIONS FOR PROCEDURE:  The patient, Sarah Hurley, is a 65 y.o. female born on 12/25/1955, is here for  immediate first stage breast reconstruction with placement of right tissue expander and Acellular dermal matrix. MRN: [id number]  CONSENT:  Informed consent was obtained directly from the patient. Risks, benefits and alternatives were fully discussed. Specific risks including but not limited to bleeding, infection, hematoma, seroma, scarring, pain, implant infection, implant extrusion, capsular contracture, asymmetry, wound healing problems, and need for further surgery were all discussed. The patient did have an ample opportunity to have her questions answered to her satisfaction.   DESCRIPTION OF PROCEDURE:  The patient was taken to the operating room by the general surgery team. SCDs were placed and IV antibiotics were given. The patient's chest was prepped and draped in a sterile fashion. A time out was performed and the implants to be used were identified.  Right mastectomies were performed.  Once the general surgery team had completed their portion of the case the patient was rendered to the plastic and reconstructive surgery team.  Right:  The pectoralis major muscle was lifted from the chest wall with release of the lateral edge and lateral inframammary fold.  I had to release quite a bit of muscle medially and inferiorly.   The pocket was irrigated with antibiotic solution and hemostasis was achieved with electrocautery.  The ADM was then prepared according to the manufacture guidelines and slits placed to help with postoperative fluid management.  The ADM was then sutured to the inferior and lateral edge of the inframammary fold with 2-0 PDS starting with an interrupted stitch and then a running stitch.  The lateral portion was sutured to with interrupted sutures after the expander was placed.  The expander was prepared according to the manufacture guidelines, the air evacuated and then it was placed under the ADM and pectoralis major muscle.  The inferior and lateral tabs were used to secure the expander to the chest wall with 2-0 PDS.  The drain was placed at the inframammary fold over the ADM and secured to the skin with 3-0 Silk.    The deep layers were closed with 3-0 Monocryl followed by 4-0 Monocryl.  The skin was closed with 5-0 Monocryl and then dermabond was applied.  The ABDs and breast binder were placed.  The patient tolerated the procedure well and there were no complications.  The patient was allowed to wake from anesthesia and taken to the recovery room in satisfactory condition.

## 2020-06-17 NOTE — Discharge Instructions (Signed)
INSTRUCTIONS FOR AFTER SURGERY   You will likely have some questions about what to expect following your operation.  The following information will help you and your family understand what to expect when you are discharged from the hospital.  Following these guidelines will help ensure a smooth recovery and reduce risks of complications.  Postoperative instructions include information on: diet, wound care, medications and physical activity.  AFTER SURGERY Expect to go home after the procedure.  In some cases, you may need to spend one night in the hospital for observation.  DIET This surgery does not require a specific diet.  However, I have to mention that the healthier you eat the better your body can start healing. It is important to increasing your protein intake.  This means limiting the foods with added sugar.  Focus on fruits and vegetables and some meat. It is very important to drink water after your surgery.  If your urine is bright yellow, then it is concentrated, and you need to drink more water.  As a general rule after surgery, you should have 8 ounces of water every hour while awake.  If you find you are persistently nauseated or unable to take in liquids let us know.  NO TOBACCO USE or EXPOSURE.  This will slow your healing process and increase the risk of a wound.  WOUND CARE If you have a drain: Clean with baby wipes for 3-5 days and then you can shower.  If you have a binder you may remove it to shower and then put it back on. If you have steri-strips / tape directly attached to your skin leave them in place. It is OK to get these wet.  No baths, pools or hot tubs for two weeks. We close your incision to leave the smallest and best-looking scar. No ointment or creams on your incisions until given the go ahead.  Especially not Neosporin (Too many skin reactions with this one).  A few weeks after surgery you can use Mederma and start massaging the scar. We ask you to wear your binder or  sports bra for the first 6 weeks around the clock, including while sleeping. This provides added comfort and helps reduce the fluid accumulation at the surgery site.  ACTIVITY No heavy lifting until cleared by the doctor.  It is OK to walk and climb stairs. In fact, moving your legs is very important to decrease your risk of a blood clot.  It will also help keep you from getting deconditioned.  Every 1 to 2 hours get up and walk for 5 minutes. This will help with a quicker recovery back to normal.  Let pain be your guide so you don't do too much.  NO, you cannot do the spring cleaning and don't plan on taking care of anyone else.  This is your time for TLC.   WORK Everyone returns to work at different times. As a rough guide, most people take at least 1 - 2 weeks off prior to returning to work. If you need documentation for your job, bring the forms to your postoperative follow up visit.  DRIVING Arrange for someone to bring you home from the hospital.  You may be able to drive a few days after surgery but not while taking any narcotics or valium.  BOWEL MOVEMENTS Constipation can occur after anesthesia and while taking pain medication.  It is important to stay ahead for your comfort.  We recommend taking Milk of Magnesia (2 tablespoons; twice   a day) while taking the pain pills.  SEROMA This is fluid your body tried to put in the surgical site.  This is normal but if it creates excessive pain and swelling let us know.  It usually decreases in a few weeks.  MEDICATIONS and PAIN CONTROL At your preoperative visit for you history and physical you were given the following medications: 1. An antibiotic: Start this medication when you get home and take according to the instructions on the bottle. 2. Zofran 4 mg:  This is to treat nausea and vomiting.  You can take this every 6 hours as needed and only if needed. 3. Norco (hydrocodone/acetaminophen) 5/325 mg:  This is only to be used after you have  taken the motrin or the tylenol. Every 8 hours as needed. Over the counter Medication to take: 4. Ibuprofen (Motrin) 600 mg:  Take this every 6 hours.  If you have additional pain then take 500 mg of the tylenol.  Only take the Norco after you have tried these two. 5. Miralax or stool softener of choice: Take this according to the bottle if you take the Norco.  WHEN TO CALL Call your surgeon's office if any of the following occur: . Fever 101 degrees F or greater . Excessive bleeding or fluid from the incision site. . Pain that increases over time without aid from the medications . Redness, warmth, or pus draining from incision sites . Persistent nausea or inability to take in liquids . Severe misshapen area that underwent the operation.  [facility]  What is the benefit of having a drain?  During surgery your tissue layers are separated.  This raw surface stimulates your body to fill the space with serous fluid.  This is normal but you don't want that fluid to collect and prevent healing.  A fluid collection can also become infected.  The Jackson-Pratt (JP) drain is used to eliminate this collection of fluid and allow the tissue to heal together.    Jackson-Pratt (JP) bulb    How to care for your drainage and suction unit at home Your drainage catheter will be connected to a collection device. The vacuum caused when the device is compressed allows drainage to collect in the device.    . Wash your hands with soap and water before and after touching the system. . Empty the JP drain every 12 hours once you get home from your procedure. . Record the fluid amount on the record sheet included. . Start with stripping the drain tube to push the clots or excess fluid to the bulb.  Do this by pinching the tube with one hand near your skin.  Then with the other hand squeeze the tubing and work it toward the bulb.  This should be done several times a day.  This may collapse the  tube which will correct on its own.   . Use a safety pin to attach your collection device to your clothing so there is no tension on the insertion site.   . If you have drainage at the skin insertion site, you can apply a gauze dressing and secure it with tape. . If the drain falls out, apply a gauze dressing over the drain insertion site and secure with tape.   To empty the collection device:   . Release the stopper on the top of the collection unit (bulb).  . Pour contents into a measuring container such as a plastic medicine cup.  . Record the   the day and amount of drainage on the attached sheet. . This should be done at least twice a day.    To compress the Jackson-Pratt Bulb:  . Release the stopper at the top of the bulb. Marland Kitchen Squeeze the bulb tightly in your fist, squeezing air out of the bulb.  . Replace the stopper while the bulb is compressed.  . Be careful not to spill the contents when squeezing the bulb. . The drainage will start bright red and turn to pink and then yellow with time. . IMPORTANT: If the bulb is not squeezed before adding the stopper it will not draw out the fluid.  Care for the JP drain site and your skin daily:  . You may shower three days after surgery. . Secure the drain to a ribbon or cloth around your waist while showering so it does not pull out while showering. . Be sure your hands are cleaned with soap and water. . Use a clean wet cotton swab to clean the skin around the drain site.  . Use another cotton swab to place Vaseline or antibiotic ointment on the skin around the drain.     Contact your physician if any of the following occur:  Marland Kitchen The fluid in the bulb becomes cloudy. . Your temperature is greater than 101.4.  Marland Kitchen The incision opens. . If you have drainage at the skin insertion site, you can apply a gauze dressing and secure it with tape. . If the drain falls out, apply a gauze dressing over the drain insertion site and secure with tape.  . You will  usually have more drainage when you are active than while you rest or are asleep. If the drainage increases significantly or is bloody call the physician                             Bring this record with you to each office visit Date  Drainage Volume  Date   Drainage volume                                                                                                                                                                                         AMBULATORY SURGERY  DISCHARGE INSTRUCTIONS   1) The drugs that you were given will stay in your system until tomorrow so for the next 24 hours you should not:  A) Drive an automobile B) Make any legal decisions C) Drink any alcoholic beverage   2) You may resume regular meals tomorrow.  Today it is better to start with liquids and gradually work up  to solid foods.  You may eat anything you prefer, but it is better to start with liquids, then soup and crackers, and gradually work up to solid foods.   3) Please notify your doctor immediately if you have any unusual bleeding, trouble breathing, redness and pain at the surgery site, drainage, fever, or pain not relieved by medication.  Please contact your physician with any problems or Same Day Surgery at [phone], Monday through Friday 6 am to 4 pm, or [place] at [facility] number at [phone].

## 2020-06-17 NOTE — Anesthesia Procedure Notes (Addendum)
Procedure Name: Intubation Date/Time: 06/17/2020 12:51 PM Performed by: Lia Foyer, CRNA Pre-anesthesia Checklist: Patient identified, Emergency Drugs available, Suction available and Patient being monitored Patient Re-evaluated:Patient Re-evaluated prior to induction Oxygen Delivery Method: Circle system utilized Preoxygenation: Pre-oxygenation with 100% oxygen Induction Type: IV induction Ventilation: Mask ventilation without difficulty Laryngoscope Size: McGraph and 3 Grade View: Grade I Tube type: Oral Tube size: 7.0 mm Number of attempts: 1 Airway Equipment and Method: Stylet and Video-laryngoscopy Placement Confirmation: ETT inserted through vocal cords under direct vision,  positive ETCO2 and breath sounds checked- equal and bilateral Secured at: 20 cm Tube secured with: Tape Dental Injury: Teeth and Oropharynx as per pre-operative assessment

## 2020-06-17 NOTE — Progress Notes (Signed)
Patient experienced 8/10 pain with nausea and one episode of vomiting. Patient's PostOp RN informed attending Dr Carole Binning and received verbal orders for medications.  This RN informed Anesthesilogist , Dr. Amie Critchley of orders and the concurred with directives given.

## 2020-06-17 NOTE — Progress Notes (Signed)
Patient had x1 episode of nausea with vomiting, and complained of 8/10 pain to her right breast , Dr. Bary Castilla made aware, verbal order received for Morphine 2mg  Iv x 1, and Phenergan 6.25 mg IV x 1, order repeated to provider.

## 2020-06-17 NOTE — Anesthesia Preprocedure Evaluation (Signed)
Anesthesia Evaluation  Patient identified by MRN, date of birth, ID band Patient awake    Reviewed: Allergy & Precautions, NPO status , Patient's Chart, lab work & pertinent test results  History of Anesthesia Complications (+) PONV and history of anesthetic complications  Airway Mallampati: II  TM Distance: >3 FB Neck ROM: Full    Dental  (+) Caps, Dental Advidsory Given, Teeth Intact   Pulmonary neg pulmonary ROS, neg shortness of breath, neg sleep apnea, neg COPD, neg recent URI,    breath sounds clear to auscultation- rhonchi (-) wheezing      Cardiovascular Exercise Tolerance: Good (-) hypertension(-) angina(-) CAD, (-) Past MI, (-) Cardiac Stents and (-) CABG (-) dysrhythmias + Valvular Problems/Murmurs  Rhythm:Regular Rate:Normal - Systolic murmurs and - Diastolic murmurs    Neuro/Psych neg Seizures negative neurological ROS  negative psych ROS   GI/Hepatic Neg liver ROS, GERD  ,  Endo/Other  negative endocrine ROSneg diabetes  Renal/GU negative Renal ROS     Musculoskeletal  (+) Arthritis ,   Abdominal (+) - obese,   Peds  Hematology negative hematology ROS (+)   Anesthesia Other Findings Past Medical History: No date: Arthritis 12/20/2017: Breast mass, right     Comment:  PLEOMORPHIC LOBULAR CARCINOMA IN SITU No date: Cancer ([place])     Comment:  no hx of cancer per pt No date: Complication of anesthesia     Comment:  PT STATES THAT ANESTHESIA HAS ALWAYS USE THE ANESTHESIA               THAT IS GIVEN TO HEART PATIENTS No date: Fluttering heart No date: GERD (gastroesophageal reflux disease)     Comment:  OCC No date: Heart murmur No date: History of methicillin resistant staphylococcus aureus (MRSA) No date: PONV (postoperative nausea and vomiting) No date: Seasonal allergies No date: Thyroid nodule   Reproductive/Obstetrics                             Anesthesia  Physical  Anesthesia Plan  ASA: II  Anesthesia Plan: General   Post-op Pain Management:    Induction: Intravenous  PONV Risk Score and Plan: 3 and Dexamethasone, Ondansetron, Midazolam, Propofol infusion and TIVA  Airway Management Planned: Oral ETT  Additional Equipment:   Intra-op Plan:   Post-operative Plan: Extubation in OR  Informed Consent: I have reviewed the patients History and Physical, chart, labs and discussed the procedure including the risks, benefits and alternatives for the proposed anesthesia with the patient or authorized representative who has indicated his/her understanding and acceptance.     Dental advisory given  Plan Discussed with: CRNA and Anesthesiologist  Anesthesia Plan Comments:         Anesthesia Quick Evaluation

## 2020-06-17 NOTE — Op Note (Signed)
Preoperative diagnosis: Invasive mammary carcinoma of the right breast.  Postoperative diagnosis: Same.  Operative procedure: Pectoral block, right simple mastectomy with sentinel node biopsy x3.  Operating surgeon: Hervey Ard, MD.  Anesthesia: General endotracheal, Marcaine 0.  2 5% with 1: 200,000 units of epinephrine, 20 cc.  Estimated blood loss: 15 cc.  Clinical note: This 65 year old woman was identified with a right breast cancer.  Based on her small breast volume and desire to avoid radiation she was felt to be best served by breast conservation.  She desired this nipple preservation if possible.  The patient was injected with technetium sulfur colloid prior to presentation to the operating theater.  SCD stockings for DVT prevention.  Ancef was administered on induction of anesthesia.  Operative note: The right breast chest and axilla was cleansed with ChloraPrep and draped.  Under ultrasound guidance local anesthesia was used to achieve a pectoralis block.  This was completed without incident.  Ultrasound showed the breast mass extending to almost the base of the dermis in the 10 o'clock position, 6-7 cm from the nipple.  It was elected to excise an ellipse of skin 2 and half centimeters wide and 7 cm in length to minimize the chance for a positive anterior margin.  This was outlined and then the skin incised sharply.  The remaining dissection was completed with the photon blade.  The flaps were elevated to approximately 2 fingerbreadths below the clavicle where the breast tissue and, medially to the sternum, inferiorly to the rectus fascia just above the level of the inframammary fold and laterally to the serratus muscle.  Hemostasis was electrocautery.  The breast was elevated off the underlying pectoralis muscle taking the fascia of that muscle with the specimen.  The axilla was identified and 3 hot lymph nodes were removed and sent in formalin for routine histology.  Counts were 4500,  2000 and 440.  The breast parenchyma was dissected off the overlying nipple areolar complex with cautery.  A nylon suture was placed into this tissue for identification pathology.  This was again about 6-centimeters away from the primary tumor mass.  The flaps appeared healthy.  The breast specimen was hand-delivered to pathology and gross exam showed no evidence of margin involvement near the area of tumor.  At this time the procedure was turned over to [facility], DO for placement of the tissue expander.  This will be dictated separately.

## 2020-06-17 NOTE — Progress Notes (Signed)
Pain level down to 4/10. Incision site D&I. Ice pack to r breast. JP drained for 88mls and teaching/instructions on emptying the drain visually shown to patient and husband. Nausea had subsided prior to Morphine given, phenergan withheld and Dr Fleet Contras aware.

## 2020-06-17 NOTE — Interval H&P Note (Signed)
History and Physical Interval Note:  06/17/2020 12:59 PM  Sarah Hurley  has presented today for surgery, with the diagnosis of right breast cancer.  The various methods of treatment have been discussed with the patient and family. After consideration of risks, benefits and other options for treatment, the patient has consented to  Procedure(s): SIMPLE MASTECTOMY WITH AXILLARY SENTINEL NODE BIOPSY (Right) IMMEDIATE RIGHT BREAST RECONSTRUCTION WITH PLACEMENT OF TISSUE EXPANDER AND FLEX HD (ACELLULAR HYDRATED DERMIS) (Right) as a surgical intervention.  The patient's history has been reviewed, patient examined, no change in status, stable for surgery.  I have reviewed the patient's chart and labs.  Questions were answered to the patient's satisfaction.     [name]

## 2020-06-18 ENCOUNTER — Encounter: Payer: Self-pay | Admitting: General Surgery

## 2020-06-18 NOTE — Anesthesia Postprocedure Evaluation (Signed)
Anesthesia Post Note  Patient: Sarah Hurley  Procedure(s) Performed: SIMPLE MASTECTOMY WITH AXILLARY SENTINEL NODE BIOPSY (Right ) IMMEDIATE RIGHT BREAST RECONSTRUCTION WITH PLACEMENT OF TISSUE EXPANDER AND FLEX HD (ACELLULAR HYDRATED DERMIS) (Right )  Patient location during evaluation: PACU Anesthesia Type: General Level of consciousness: awake and alert Pain management: pain level controlled Vital Signs Assessment: post-procedure vital signs reviewed and stable Respiratory status: spontaneous breathing, nonlabored ventilation, respiratory function stable and patient connected to nasal cannula oxygen Cardiovascular status: blood pressure returned to baseline and stable Postop Assessment: no apparent nausea or vomiting Anesthetic complications: no   No complications documented.   Last Vitals:  Vitals:   06/17/20 1800 06/17/20 1830  BP: (!) 152/77 (!) 152/77  Pulse: 62 65  Resp: 16 14  Temp:    SpO2: 98% 99%    Last Pain:  Vitals:   06/18/20 1358  TempSrc:   PainSc: 3                  Martha Clan

## 2020-06-22 ENCOUNTER — Telehealth: Payer: Self-pay

## 2020-06-22 LAB — SURGICAL PATHOLOGY

## 2020-06-22 NOTE — Telephone Encounter (Signed)
Patient called to say that she has an appointment with [name] on Friday, but would like to come in earlier to have her drains taken out.  Patient said that she had less than 10 ml this morning and hasn't had 30 ml since 06/19/2020.  Please call.

## 2020-06-22 NOTE — Telephone Encounter (Signed)
Called and spoke with the patient regarding the message below.  Informed the patient that I spoke with [facility] and he stated that she will need to keep her first Pre-op appointment on (06/26/2020).  We don't want to take the drains out to soon.  Patient verbalized understanding and agreed.//AB/CMA

## 2020-06-26 ENCOUNTER — Ambulatory Visit (INDEPENDENT_AMBULATORY_CARE_PROVIDER_SITE_OTHER): Payer: 59 | Admitting: Plastic Surgery

## 2020-06-26 ENCOUNTER — Encounter: Payer: Self-pay | Admitting: Plastic Surgery

## 2020-06-26 ENCOUNTER — Other Ambulatory Visit: Payer: Self-pay

## 2020-06-26 DIAGNOSIS — Z719 Counseling, unspecified: Secondary | ICD-10-CM

## 2020-06-26 DIAGNOSIS — C50411 Malignant neoplasm of upper-outer quadrant of right female breast: Secondary | ICD-10-CM

## 2020-06-26 NOTE — Progress Notes (Signed)
   Subjective:    Patient ID: Sarah Hurley, female    DOB: 1955/07/01, 65 y.o.   MRN: [id number]  Patient is a 65 year old female here for a follow-up after undergoing a right mastectomy she has an expander in place.  Her history is that she had a partial mastectomy and February 2020 of the right breast.  She did not have radiation at that time and was found to have a recurrence.  She decided on a right mastectomy and that is what we did last week.  She is not a smoker and does not have diabetes.  She is 5 feet 3 inches tall weighs 104 pounds.  She is very unhappy with the drain and would like it removed sooner than later.  Her skin is bruised as expected but overall she is doing well.  There is no sign of a hematoma or seroma.     Review of Systems  Constitutional: Negative.   Eyes: Negative.   Respiratory: Negative.   Cardiovascular: Negative.   Genitourinary: Negative.        Objective:   Physical Exam Vitals and nursing note reviewed.  Constitutional:      Appearance: Normal appearance.  HENT:     Head: Normocephalic and atraumatic.  Cardiovascular:     Rate and Rhythm: Normal rate.     Pulses: Normal pulses.  Neurological:     General: No focal deficit present.     Mental Status: She is alert. Mental status is at baseline.  Psychiatric:        Mood and Affect: Mood normal.        Behavior: Behavior normal.       Assessment & Plan:     ICD-10-CM   1. Carcinoma of upper-outer quadrant of female breast, right ([place])  C50.411     We discussed the importance of keeping the drain in for 1 more week.  The patient has agreed.  We can see her next Wednesday and remove the drain.  We did not do a fill today we will wait until the next 1 to 2 weeks.

## 2020-07-01 ENCOUNTER — Ambulatory Visit (INDEPENDENT_AMBULATORY_CARE_PROVIDER_SITE_OTHER): Payer: 59 | Admitting: Surgical

## 2020-07-01 ENCOUNTER — Other Ambulatory Visit: Payer: Self-pay

## 2020-07-01 DIAGNOSIS — D0501 Lobular carcinoma in situ of right breast: Secondary | ICD-10-CM

## 2020-07-01 DIAGNOSIS — C50411 Malignant neoplasm of upper-outer quadrant of right female breast: Secondary | ICD-10-CM

## 2020-07-01 NOTE — Progress Notes (Signed)
Patient is a 65 year old female here for follow-up on her right breast reconstruction.  She underwent mastectomy followed by reconstruction with placement of tissue expander on 06/17/2020.  She is 2 weeks postop.  She is here for drain removal.  She did recently see general surgery and they are planning on additional right axillary node dissection and depending on those biopsy results patient may need radiation.  Patient reports she is overall feeling well.  No fevers, chills, nausea, vomiting.  Reports pain is well controlled with the exception of the drain site.  She reports drain output has been less than 10 cc per 24 hours.  Chaperone present on exam On exam right breast incision is covered with honeycomb dressing and Steri-Strips.  Honeycomb dressing was removed.  Right JP drain in place.  There is no erythema or cellulitic changes noted.  There is some superficial sloughing of the right NAC but it overall appears viable and there is no necrosis noted. No subcutaneous fluid collection noted with palpation.   Right JP drain removed for low output.  Recommend Vaseline and gauze dressing changes daily to right JP drain insertion site.  Recommend following up in 1 week for reevaluation and possible fill.  We opted not to fill today due to the NAC superficial skin sloughing.  We did not fill the expander today Right: 150 cc / 300 cc

## 2020-07-06 ENCOUNTER — Other Ambulatory Visit: Payer: Self-pay | Admitting: General Surgery

## 2020-07-06 NOTE — Progress Notes (Signed)
65 year old female here for follow-up on her right breast reconstruction.  She underwent mastectomy followed by immediate right breast reconstruction with placement of tissue expander on 06/17/2020.  She is approximately 3 weeks postop.  She is scheduled for additional right axillary node dissection 07/29/20 with Dr. Bary Castilla May require radiation pending results of lymph node.  Wound of right breast incision, appears superficial. No surrounding erythema.  No exposed expanders noted.  No cellulitic changes.  No subcutaneous fluid collections noted. Right nipple areolar complex has some superficial scabbing but no necrosis is noted.  No wound of the nipple areolar complex is noted.  We DID NOT place injectable saline in the Expander using a sterile technique: Right: 0 cc for a total of 150 / 300 cc.  Recommend Xeroform dressing changes daily, cover this 4 x 4 gauze.  Continue to wear compressive garment.  Avoid strenuous activities and heavy lifting.  Recommend following up in 1 week for reevaluation.  We did not fill to avoid any excessive tension on the incision to prevent incisional dehiscence.  Recommend calling with questions or concerns or if her symptoms change.  There is no sign of infection, seroma, hematoma at this time.  Picture was taken and placed in the patient's chart with patient's permission.

## 2020-07-06 NOTE — Progress Notes (Signed)
Subjective:     Patient ID: Sarah Hurley is a 65 y.o. female.  HPI  The following portions of the patient's history were reviewed and updated as appropriate.  This an established patient is here today for: office visit. She is here for her postoperative, post right mastectomy with reconstruction by Dr Marla Roe on 06-17-20. She states she is doing well only using tylenol and Advil for discomfort.  Her main complaint is aggravation at the drain placed at the time of surgery is traumatized by her bra. She is here with her husband, Donnie.   Review of Systems  Constitutional: Negative for chills and fever.  Respiratory: Negative for cough.        Chief Complaint  Patient presents with  . Post-op Problem    RT mastectomy 2 weeks Post op      BP 134/84   Pulse 83   Temp 36.3 C (97.4 F)   Ht 160 cm (5\' 3" )   Wt 46.3 kg (102 lb)   SpO2 98%   BMI 18.07 kg/m       Past Medical History:  Diagnosis Date  . Allergy   . Arthritis   . Cardiac murmur   . Colon polyp    HYPERPLASTIC  . Complication of anesthesia   . Fluttering heart   . GERD (gastroesophageal reflux disease)   . History of methicillin resistant Staphylococcus aureus   . PONV (postoperative nausea and vomiting)   . Seasonal allergies   . Thyroid nodule           Past Surgical History:  Procedure Laterality Date  . ASPIRATION CYST BREAST Bilateral 2003   negative  . BREAST EXCISIONAL BIOPSY Right 04/09/2020   ultrasound guided core  . BREAST EXCISIONAL BIOPSY Right 12/20/2017  . COLONOSCOPY  2015  . FINE NEEDLE ASPIRATION  04/29/2013   Ultrasound-guided fine needle aspiration 4.5 cm right-sided thyroid nodule  . history of eye surgery Bilateral   . MASTECTOMY PARTIAL / LUMPECTOMY Right 03/26/2018  . MASTECTOMY SIMPLE Right 06/17/2020   Dr. Bary Castilla; combined case with Dr. Marla Roe for tissue expander  . tonsil removal    . TONSILLECTOMY  1976               OB History    Gravida  2   Para  2   Term      Preterm      AB      Living        SAB      IAB      Ectopic      Molar      Multiple      Live Births          Obstetric Comments  Age at first period 52 Age of first pregnancy 48        Social History          Socioeconomic History  . Marital status: Married  Occupational History  . Occupation: Self employed  Tobacco Use  . Smoking status: Never Smoker  . Smokeless tobacco: Never Used  . Tobacco comment: Does not smoke  Substance and Sexual Activity  . Alcohol use: Yes    Alcohol/week: 2.0 standard drinks    Types: 2 Glasses of wine per week    Comment: 2 glasses of wine per day  . Drug use: Never            Allergies  Allergen Reactions  . Penicillins Rash  Reaction as a child    Current Medications        Current Outpatient Medications  Medication Sig Dispense Refill  . acetaminophen (TYLENOL) 325 MG tablet Take 650 mg by mouth every 4 (four) hours as needed for Pain    . fluticasone (FLONASE) 50 mcg/actuation nasal spray Place 2 sprays into both nostrils once daily    . ibuprofen (MOTRIN) 200 MG tablet Take 200 mg by mouth every 6 (six) hours as needed for Pain    . olopatadine (PATANOL) 0.1 % ophthalmic solution Apply to eye    . triamcinolone 0.1 % cream Apply topically    . triamcinolone acetonide (NASACORT NASAL) Place into one nostril once daily    . TYRVAYA 0.03 mg/spray sprm SPRAY 1 PUFF INTO BOTH NOSTRILS TWICE A DAY    . lidocaine-prilocaine (EMLA) cream Apply to areola one hour prior to arrival day of procedure. Cover with saran wrap. (Patient not taking: Reported on 06/30/2020) 5 g 0   No current facility-administered medications for this visit.           Family History  Problem Relation Age of Onset  . High blood pressure (Hypertension) Mother   . Myocardial Infarction (Heart attack) Father 63  . Heart disease Father   . Thyroid  disease Sister   . Breast cancer Sister        DCIS  . Thyroid disease Sister   . Colon cancer Neg Hx           Objective:   Physical Exam Exam conducted with a chaperone present.  Constitutional:      Appearance: Normal appearance.  Cardiovascular:     Rate and Rhythm: Normal rate and regular rhythm.     Pulses: Normal pulses.     Heart sounds: Normal heart sounds.  Pulmonary:     Effort: Pulmonary effort is normal.     Breath sounds: Normal breath sounds.  Chest:  Breasts:     Left: Normal.        Comments: 1 cm dusky area in the lower outer quadrant of the areola.  Wide excision site well-healed.  Flaps healthy.  Drain intact.  No upper extremity swelling noted. Musculoskeletal:     Cervical back: Neck supple.  Skin:    General: Skin is warm and dry.  Neurological:     Mental Status: She is alert and oriented to person, place, and time.  Psychiatric:        Mood and Affect: Mood normal.        Behavior: Behavior normal.    Labs and Radiology:   Jun 17, 2020 pathology:  DIAGNOSIS:  A. RIGHT BREAST; SIMPLE MASTECTOMY:  - INVASIVE LOBULAR CARCINOMA.  - METASTATIC CARCINOMA INVOLVING ONE LYMPH NODE (1/1).  - SEE CANCER SUMMARY BELOW.   B. SENTINEL LYMPH NODES, RIGHT AXILLARY; EXCISIONAL BIOPSY:  - METASTATIC CARCINOMA INVOLVING 2 OF 3 LYMPH NODES (2/3).   pT2  Regional Lymph Nodes Modifier: sn  pN1a     Assessment:     Upstaging to T2, N1, stage II carcinoma.    Plan:     With the finding of 2 of 3 sentinel nodes positive, and 1 intramammary node positive, she is at the border of strong benefit from axillary radiation.  Avoiding radiation was a goal for the patient from the beginning, and it seems that she would be best served by axillary dissection.  If no additional nodes are found she can likely omit radiation and then  proceed with adjuvant therapy be at hormone-based or chemotherapy or for Oncotype DX score is high.  The case  was reviewed by text with Dr. Grayland Ormond.  He will make arrangements for Oncotype DX testing and PET/CT imaging.  Patient wants to avoid surgery until after her upcoming beach trip June 18 through June 25.  This is reasonable.  We should have Oncotype results back by that time and if needed a port can be placed if she is amenable to chemotherapy.    Follow up pending scheduling of axillary dissection at the end of June.   This note is partially prepared by Karie Fetch, RN, acting as a scribe in the presence of Dr. Hervey Ard, MD.  The documentation recorded by the scribe accurately reflects the service I personally performed and the decisions made by me.   Robert Bellow, MD FACS

## 2020-07-07 ENCOUNTER — Other Ambulatory Visit: Payer: Self-pay | Admitting: General Surgery

## 2020-07-07 ENCOUNTER — Ambulatory Visit (INDEPENDENT_AMBULATORY_CARE_PROVIDER_SITE_OTHER): Payer: 59 | Admitting: Surgical

## 2020-07-07 ENCOUNTER — Other Ambulatory Visit: Payer: Self-pay

## 2020-07-07 ENCOUNTER — Encounter: Payer: Self-pay | Admitting: Surgical

## 2020-07-07 DIAGNOSIS — D0501 Lobular carcinoma in situ of right breast: Secondary | ICD-10-CM

## 2020-07-07 DIAGNOSIS — C50411 Malignant neoplasm of upper-outer quadrant of right female breast: Secondary | ICD-10-CM

## 2020-07-13 ENCOUNTER — Other Ambulatory Visit: Payer: Self-pay | Admitting: Oncology

## 2020-07-14 ENCOUNTER — Encounter: Payer: Self-pay | Admitting: Surgical

## 2020-07-14 ENCOUNTER — Ambulatory Visit (INDEPENDENT_AMBULATORY_CARE_PROVIDER_SITE_OTHER): Payer: 59 | Admitting: Surgical

## 2020-07-14 ENCOUNTER — Other Ambulatory Visit: Payer: Self-pay

## 2020-07-14 DIAGNOSIS — C50411 Malignant neoplasm of upper-outer quadrant of right female breast: Secondary | ICD-10-CM

## 2020-07-14 DIAGNOSIS — D0501 Lobular carcinoma in situ of right breast: Secondary | ICD-10-CM

## 2020-07-14 NOTE — Progress Notes (Signed)
65 year old female here for follow-up on her right breast reconstruction with placement of tissue expanders on 06/17/2020.  She is 1 month postop.  She is scheduled for additional right axillary node dissection on 07/29/2020 with Dr. Bary Castilla.  May require radiation pending results of lymph node biopsies.  She was last seen on 07/07/2020.  She has a wound of the right breast incision.  Chaperone present on exam On exam right breast incision is intact laterally, however medially she had some breakdown which is approximately 2 mm deep.  I was unable to see any exposed expander, however there is some concern for this occurring.  There is no surrounding erythema.  No cellulitic changes noted.  Right NAC is viable with some scabbing noted.  No foul odor is noted.   We did not place injectable saline in the Expander using a sterile technique: Right: 0 cc for a total of 150 / 300 cc  Donated ACell was placed over the right breast wound.  Some fibrinous exudate was debrided from the right breast wound using scissors and sharp pickup.  Patient tolerated this fine.  Recommend applying K-Y jelly daily to the right breast wound.  Patient is scheduled for a follow-up 07/17/2020.  Patient may benefit from excision of the right breast wound and primary closure, this may be something we can do in the office.  Patient will follow-up on 07/17/2020 to reevaluate and further discuss.  Picture was taken and placed in the patient's chart with patient permission.  Will discuss patient plan with Dr. Marla Roe

## 2020-07-16 NOTE — Progress Notes (Signed)
Patient is a 65 year old female here for follow-up on her right breast reconstruction.  She currently has a right tissue expander in place.  She is just over 1 month postop.  She does have a wound of her right breast which we recently placed donated ACell powder to assist with granulation tissue.  We have previously discussed that if the expander becomes exposed it may need to be removed and replaced to prevent additional complications.  We have not placed any additional saline in the expander at this time to avoid any additional pressure on the incision.  Patient reports he is doing well.  She feels that she has noticed some improvement after the use of the donated ACell.  She is scheduled to go to the beach tomorrow for a week.  Chaperone present on exam Right breast incision intact laterally.  Medially she still has the breast wound present there is a new base of granulation tissue with some fibrinous exudate still noted.  But it does not appear as deep today.  There is no surrounding erythema.  There is no skin necrosis noted.  No subcutaneous fluid collection noted with palpation.   Additional donated ACell was placed over the right breast wound. Recommend K-Y jelly and Adaptic dressing changes daily starting 07/19/2020. Picture was taken and placed the patient's chart with patient permission.  I do not see any sign of infection, seroma, hematoma.  Recommend following up after returning from the beach.  We did not fill her expander today.  She currently has 150 out of 300 cc in the right breast expander.

## 2020-07-17 ENCOUNTER — Ambulatory Visit (INDEPENDENT_AMBULATORY_CARE_PROVIDER_SITE_OTHER): Payer: 59 | Admitting: Surgical

## 2020-07-17 ENCOUNTER — Encounter: Payer: Self-pay | Admitting: Surgical

## 2020-07-17 ENCOUNTER — Other Ambulatory Visit: Payer: Self-pay

## 2020-07-17 DIAGNOSIS — C50411 Malignant neoplasm of upper-outer quadrant of right female breast: Secondary | ICD-10-CM

## 2020-07-17 DIAGNOSIS — D0501 Lobular carcinoma in situ of right breast: Secondary | ICD-10-CM

## 2020-07-22 ENCOUNTER — Encounter
Admission: RE | Admit: 2020-07-22 | Discharge: 2020-07-22 | Disposition: A | Discharge status: Home / Self Care | Payer: 59 | Source: Ambulatory Visit | Attending: General Surgery | Admitting: General Surgery

## 2020-07-22 ENCOUNTER — Other Ambulatory Visit: Payer: Self-pay

## 2020-07-22 NOTE — Patient Instructions (Signed)
Your procedure is scheduled on:07-29-20 WEDNESDAY Report to the Registration Desk on the 1st floor of the [facility]-Then proceed to the 2nd floor Surgery Desk in the [facility] To find out your arrival time, please call [phone] between 1PM - 3PM on:07-28-20 TUESDAY  REMEMBER: Instructions that are not followed completely may result in serious medical risk, up to and including death; or upon the discretion of your surgeon and anesthesiologist your surgery may need to be rescheduled.  Do not eat food after midnight the night before surgery.  No gum chewing, lozengers or hard candies.  You may however, drink CLEAR liquids up to 2 hours before you are scheduled to arrive for your surgery. Do not drink anything within 2 hours of your scheduled arrival time.  Clear liquids include: - water  - apple juice without pulp - gatorade  - black coffee or tea (Do NOT add milk or creamers to the coffee or tea) Do NOT drink anything that is not on this list.  Do not take any medication the morning of surgery  One week prior to surgery: Stop Anti-inflammatories (NSAIDS) such as Advil, Aleve, Ibuprofen, Motrin, Naproxen, Naprosyn and Aspirin based products such as Excedrin, Goodys Powder, BC Powder.You may however, continue to take Tylenol if needed for pain up until the day of surgery.  Stop ANY OVER THE COUNTER supplements/vitamins NOW (07-22-20) until after surgery  No Alcohol for 24 hours before or after surgery.  No Smoking including e-cigarettes for 24 hours prior to surgery.  No chewable tobacco products for at least 6 hours prior to surgery.  No nicotine patches on the day of surgery.  Do not use any "recreational" drugs for at least a week prior to your surgery.  Please be advised that the combination of cocaine and anesthesia may have negative outcomes, up to and including death. If you test positive for cocaine, your surgery will be cancelled.  On the morning of surgery brush  your teeth with toothpaste and water, you may rinse your mouth with mouthwash if you wish. Do not swallow any toothpaste or mouthwash.  Do not wear jewelry, make-up, hairpins, clips or nail polish.  Do not wear lotions, powders, or perfumes.   Do not shave body from the neck down 48 hours prior to surgery just in case you cut yourself which could leave a site for infection.  Also, freshly shaved skin may become irritated if using the CHG soap.  Contact lenses, hearing aids and dentures may not be worn into surgery.  Do not bring valuables to the hospital. [facility] is not responsible for any missing/lost belongings or valuables.   Use CHG Soap as directed on instruction sheet.  Notify your doctor if there is any change in your medical condition (cold, fever, infection).  Wear comfortable clothing (specific to your surgery type) to the hospital.  After surgery, you can help prevent lung complications by doing breathing exercises.  Take deep breaths and cough every 1-2 hours. Your doctor may order a device called an Incentive Spirometer to help you take deep breaths. When coughing or sneezing, hold a pillow firmly against your incision with both hands. This is called "splinting." Doing this helps protect your incision. It also decreases belly discomfort.  If you are being admitted to the hospital overnight, leave your suitcase in the car. After surgery it may be brought to your room.  If you are being discharged the day of surgery, you will not be allowed to drive home.  You will need a responsible adult (18 years or older) to drive you home and stay with you that night.   If you are taking public transportation, you will need to have a responsible adult (18 years or older) with you. Please confirm with your physician that it is acceptable to use public transportation.   Please call the [facility] at [phone] if you have any questions about these  instructions.  Surgery Visitation Policy:  Patients undergoing a surgery or procedure may have one family member or support person with them as long as that person is not COVID-19 positive or experiencing its symptoms.  That person may remain in the waiting area during the procedure.  Inpatient Visitation:    Visiting hours are 7 a.m. to 8 p.m. Inpatients will be allowed two visitors daily. The visitors may change each day during the patient's stay. No visitors under the age of 77. Any visitor under the age of 12 must be accompanied by an adult. The visitor must pass COVID-19 screenings, use hand sanitizer when entering and exiting the patient's room and wear a mask at all times, including in the patient's room. Patients must also wear a mask when staff or their visitor are in the room. Masking is required regardless of vaccination status.

## 2020-07-28 ENCOUNTER — Other Ambulatory Visit: Payer: Self-pay

## 2020-07-28 ENCOUNTER — Ambulatory Visit (INDEPENDENT_AMBULATORY_CARE_PROVIDER_SITE_OTHER): Payer: 59 | Admitting: Surgical

## 2020-07-28 DIAGNOSIS — D0501 Lobular carcinoma in situ of right breast: Secondary | ICD-10-CM

## 2020-07-28 DIAGNOSIS — C50411 Malignant neoplasm of upper-outer quadrant of right female breast: Secondary | ICD-10-CM

## 2020-07-28 NOTE — Progress Notes (Signed)
Patient is a 65 year old female here for follow-up on her right breast reconstruction.  She currently has a right tissue expander in place.  She she is 6 weeks postop.  She is scheduled for axillary lymph node dissection tomorrow with Dr. Bary Castilla with general surgery.  We have been treating her right breast wound.  We have not done any expansions or fills in the expander due to the wound.  She reports today that she has not noticed much improvement in her right breast wound.  She has been doing Adaptic and K-Y jelly dressing changes.  She is scheduled for the lymph node dissection tomorrow.  Chaperone present on exam On exam right breast wound with minimal improvement.  No surrounding erythema or cellulitic changes noted.  No exposed expanders noted.  No foul odor noted.  Base of wound bed is mostly fibrinous exudate.  There is no granulation tissue noted.  She has a little bit of swelling of the inferior portion of the right breast with a small fluid collection noted.  It appears consistent with with a seroma.  She currently has 150 cc out of 300 cc in the right breast tissue expander.  Will discuss with Dr. Marla Roe if general surgery will be able to excise right breast wound while in surgery tomorrow and primarily closed.  We would certainly appreciate their assistance.  I discussed with the patient that I agree I do not see much improvement.  I would recommend she switch to Xeroform dressing changes daily.  Recommend following up in 3 to 4 weeks for reevaluation.  Picture was taken and placed in the patient's chart with patient's permission.  There is no sign of infection.  Did not aspirate right breast given the expander in place and patient returning to the OR tomorrow.

## 2020-07-29 ENCOUNTER — Encounter
Admission: RE | Disposition: A | Discharge status: Home / Self Care | Payer: Self-pay | Source: Home / Self Care | Attending: General Surgery

## 2020-07-29 ENCOUNTER — Ambulatory Visit
Admission: RE | Admit: 2020-07-29 | Discharge: 2020-07-29 | Disposition: A | Discharge status: Home / Self Care | Payer: 59 | Attending: General Surgery | Admitting: General Surgery

## 2020-07-29 ENCOUNTER — Ambulatory Visit: Payer: 59 | Admitting: Registered Nurse

## 2020-07-29 ENCOUNTER — Encounter: Payer: Self-pay | Admitting: General Surgery

## 2020-07-29 DIAGNOSIS — Z79899 Other long term (current) drug therapy: Secondary | ICD-10-CM | POA: Insufficient documentation

## 2020-07-29 DIAGNOSIS — Z880 Allergy status to penicillin: Secondary | ICD-10-CM | POA: Diagnosis not present

## 2020-07-29 DIAGNOSIS — C773 Secondary and unspecified malignant neoplasm of axilla and upper limb lymph nodes: Secondary | ICD-10-CM | POA: Insufficient documentation

## 2020-07-29 DIAGNOSIS — C50911 Malignant neoplasm of unspecified site of right female breast: Secondary | ICD-10-CM | POA: Diagnosis present

## 2020-07-29 DIAGNOSIS — Z888 Allergy status to other drugs, medicaments and biological substances status: Secondary | ICD-10-CM | POA: Diagnosis not present

## 2020-07-29 HISTORY — PX: AXILLARY LYMPH NODE DISSECTION: SHX5229

## 2020-07-29 SURGERY — LYMPHADENECTOMY, AXILLARY
Anesthesia: General | Laterality: Right

## 2020-07-29 MED ORDER — ACETAMINOPHEN 10 MG/ML IV SOLN
INTRAVENOUS | Status: DC | PRN
  Administered 2020-07-29: 700 mg via INTRAVENOUS

## 2020-07-29 MED ORDER — ACETAMINOPHEN 10 MG/ML IV SOLN
INTRAVENOUS | Status: AC
  Filled 2020-07-29: qty 100

## 2020-07-29 MED ORDER — KETAMINE HCL 50 MG/5ML IJ SOSY
PREFILLED_SYRINGE | INTRAMUSCULAR | Status: AC
  Filled 2020-07-29: qty 5

## 2020-07-29 MED ORDER — FENTANYL CITRATE (PF) 100 MCG/2ML IJ SOLN: 25.0000 ug | INTRAMUSCULAR | Status: DC | PRN

## 2020-07-29 MED ORDER — LACTATED RINGERS IV SOLN: INTRAVENOUS | Status: DC

## 2020-07-29 MED ORDER — PROMETHAZINE HCL 25 MG/ML IJ SOLN: 6.2500 mg | INTRAMUSCULAR | Status: DC | PRN

## 2020-07-29 MED ORDER — EPHEDRINE SULFATE 50 MG/ML IJ SOLN
INTRAMUSCULAR | Status: DC | PRN
  Administered 2020-07-29 (×3): 5 mg via INTRAVENOUS
  Administered 2020-07-29: 10 mg via INTRAVENOUS

## 2020-07-29 MED ORDER — APREPITANT 40 MG PO CAPS: 40.0000 mg | ORAL_CAPSULE | Freq: Once | ORAL | Status: AC

## 2020-07-29 MED ORDER — KETOROLAC TROMETHAMINE 30 MG/ML IJ SOLN
INTRAMUSCULAR | Status: AC
  Filled 2020-07-29: qty 1

## 2020-07-29 MED ORDER — PROPOFOL 10 MG/ML IV BOLUS
INTRAVENOUS | Status: AC
  Filled 2020-07-29: qty 20

## 2020-07-29 MED ORDER — EPHEDRINE 5 MG/ML INJ
INTRAVENOUS | Status: AC
  Filled 2020-07-29: qty 10

## 2020-07-29 MED ORDER — CEFAZOLIN SODIUM-DEXTROSE 2-4 GM/100ML-% IV SOLN
2.0000 g | INTRAVENOUS | Status: AC
  Administered 2020-07-29: 2 g via INTRAVENOUS

## 2020-07-29 MED ORDER — MIDAZOLAM HCL 2 MG/2ML IJ SOLN
INTRAMUSCULAR | Status: AC
  Filled 2020-07-29: qty 2

## 2020-07-29 MED ORDER — CHLORHEXIDINE GLUCONATE 0.12 % MT SOLN
15.0000 mL | Freq: Once | OROMUCOSAL | Status: AC
Start: 2020-07-29 — End: 2020-07-29
  Administered 2020-07-29: 15 mL via OROMUCOSAL

## 2020-07-29 MED ORDER — PROPOFOL 10 MG/ML IV BOLUS
INTRAVENOUS | Status: DC | PRN
  Administered 2020-07-29: 20 mg via INTRAVENOUS
  Administered 2020-07-29: 100 mg via INTRAVENOUS

## 2020-07-29 MED ORDER — MEPERIDINE HCL 25 MG/ML IJ SOLN: 6.2500 mg | INTRAMUSCULAR | Status: DC | PRN

## 2020-07-29 MED ORDER — MORPHINE SULFATE (PF) 2 MG/ML IV SOLN: 2.0000 mg | Freq: Once | INTRAVENOUS | Status: DC

## 2020-07-29 MED ORDER — LIDOCAINE HCL (PF) 2 % IJ SOLN
INTRAMUSCULAR | Status: AC
  Filled 2020-07-29: qty 5

## 2020-07-29 MED ORDER — CHLORHEXIDINE GLUCONATE 0.12 % MT SOLN
OROMUCOSAL | Status: AC
  Filled 2020-07-29: qty 15

## 2020-07-29 MED ORDER — OXYCODONE HCL 5 MG/5ML PO SOLN: 5.0000 mg | Freq: Once | ORAL | Status: DC | PRN

## 2020-07-29 MED ORDER — DEXAMETHASONE SODIUM PHOSPHATE 10 MG/ML IJ SOLN
INTRAMUSCULAR | Status: AC
  Filled 2020-07-29: qty 1

## 2020-07-29 MED ORDER — HYDROCODONE-ACETAMINOPHEN 5-325 MG PO TABS: 1.0000 | ORAL_TABLET | ORAL | 0 refills | Status: DC | PRN

## 2020-07-29 MED ORDER — ONDANSETRON HCL 4 MG/2ML IJ SOLN
INTRAMUSCULAR | Status: DC | PRN
  Administered 2020-07-29: 4 mg via INTRAVENOUS

## 2020-07-29 MED ORDER — LIDOCAINE HCL (CARDIAC) PF 100 MG/5ML IV SOSY
PREFILLED_SYRINGE | INTRAVENOUS | Status: DC | PRN
  Administered 2020-07-29: 40 mg via INTRAVENOUS

## 2020-07-29 MED ORDER — CEFAZOLIN SODIUM-DEXTROSE 2-4 GM/100ML-% IV SOLN
INTRAVENOUS | Status: AC
  Filled 2020-07-29: qty 100

## 2020-07-29 MED ORDER — FENTANYL CITRATE (PF) 100 MCG/2ML IJ SOLN
INTRAMUSCULAR | Status: AC
  Filled 2020-07-29: qty 2

## 2020-07-29 MED ORDER — OXYCODONE HCL 5 MG PO TABS: 5.0000 mg | ORAL_TABLET | Freq: Once | ORAL | Status: DC | PRN

## 2020-07-29 MED ORDER — CHLORHEXIDINE GLUCONATE CLOTH 2 % EX PADS: 6.0000 | MEDICATED_PAD | Freq: Once | CUTANEOUS | Status: DC

## 2020-07-29 MED ORDER — FENTANYL CITRATE (PF) 100 MCG/2ML IJ SOLN
INTRAMUSCULAR | Status: DC | PRN
  Administered 2020-07-29 (×4): 25 ug via INTRAVENOUS

## 2020-07-29 MED ORDER — FAMOTIDINE 20 MG PO TABS
ORAL_TABLET | ORAL | Status: AC
  Filled 2020-07-29: qty 1

## 2020-07-29 MED ORDER — FAMOTIDINE 20 MG PO TABS
20.0000 mg | ORAL_TABLET | Freq: Once | ORAL | Status: AC
  Administered 2020-07-29: 20 mg via ORAL

## 2020-07-29 MED ORDER — ORAL CARE MOUTH RINSE: 15.0000 mL | Freq: Once | OROMUCOSAL | Status: AC

## 2020-07-29 MED ORDER — APREPITANT 40 MG PO CAPS
ORAL_CAPSULE | ORAL | Status: AC
  Administered 2020-07-29: 40 mg via ORAL
  Filled 2020-07-29: qty 1

## 2020-07-29 MED ORDER — ONDANSETRON HCL 4 MG/2ML IJ SOLN
INTRAMUSCULAR | Status: AC
  Filled 2020-07-29: qty 2

## 2020-07-29 MED ORDER — KETOROLAC TROMETHAMINE 30 MG/ML IJ SOLN
INTRAMUSCULAR | Status: DC | PRN
  Administered 2020-07-29: 30 mg via INTRAVENOUS

## 2020-07-29 MED ORDER — DEXAMETHASONE SODIUM PHOSPHATE 10 MG/ML IJ SOLN
INTRAMUSCULAR | Status: DC | PRN
  Administered 2020-07-29: 10 mg via INTRAVENOUS

## 2020-07-29 MED ORDER — MIDAZOLAM HCL 2 MG/2ML IJ SOLN
INTRAMUSCULAR | Status: DC | PRN
  Administered 2020-07-29: 2 mg via INTRAVENOUS

## 2020-07-29 MED ORDER — BUPIVACAINE-EPINEPHRINE (PF) 0.25% -1:200000 IJ SOLN
INTRAMUSCULAR | Status: AC
  Filled 2020-07-29: qty 30

## 2020-07-29 SURGICAL SUPPLY — 59 items
APPLIER CLIP 11 MED OPEN (CLIP)
BAG DECANTER FOR FLEXI CONT (MISCELLANEOUS) ×4 IMPLANT
BLADE BOVIE TIP EXT 4 (BLADE) ×4 IMPLANT
BLADE SURG 15 STRL SS SAFETY (BLADE) ×4 IMPLANT
BNDG ELASTIC 6X5.8 VLCR NS LF (GAUZE/BANDAGES/DRESSINGS) ×4 IMPLANT
BNDG GAUZE ELAST 4 BULKY (GAUZE/BANDAGES/DRESSINGS) ×4 IMPLANT
CANISTER SUCT 1200ML W/VALVE (MISCELLANEOUS) ×4 IMPLANT
CHLORAPREP W/TINT 26 (MISCELLANEOUS) ×4 IMPLANT
CLIP APPLIE 11 MED OPEN (CLIP) IMPLANT
CLOSURE WOUND 1/2 X4 (GAUZE/BANDAGES/DRESSINGS) ×1
COVER LIGHT HANDLE STERIS (MISCELLANEOUS) ×8 IMPLANT
DECANTER SPIKE VIAL GLASS SM (MISCELLANEOUS) ×8 IMPLANT
DRAPE C-ARM XRAY 36X54 (DRAPES) ×4 IMPLANT
DRAPE LAPAROTOMY TRNSV 106X77 (MISCELLANEOUS) ×4 IMPLANT
DRSG GAUZE FLUFF 36X18 (GAUZE/BANDAGES/DRESSINGS) ×4 IMPLANT
DRSG TEGADERM 2-3/8X2-3/4 SM (GAUZE/BANDAGES/DRESSINGS) ×4 IMPLANT
DRSG TEGADERM 4X4.75 (GAUZE/BANDAGES/DRESSINGS) ×4 IMPLANT
DRSG TELFA 4X3 1S NADH ST (GAUZE/BANDAGES/DRESSINGS) ×4 IMPLANT
ELECT REM PT RETURN 9FT ADLT (ELECTROSURGICAL) ×4
ELECTRODE REM PT RTRN 9FT ADLT (ELECTROSURGICAL) ×2 IMPLANT
GAUZE 4X4 16PLY ~~LOC~~+RFID DBL (SPONGE) ×4 IMPLANT
GLOVE SRG 8 PF TXTR STRL LF DI (GLOVE) ×2 IMPLANT
GLOVE SURG ENC MOIS LTX SZ7.5 (GLOVE) ×4 IMPLANT
GLOVE SURG UNDER LTX SZ8 (GLOVE) ×4 IMPLANT
GLOVE SURG UNDER POLY LF SZ8 (GLOVE) ×2
GOWN STRL REUS W/ TWL LRG LVL3 (GOWN DISPOSABLE) ×4 IMPLANT
GOWN STRL REUS W/TWL LRG LVL3 (GOWN DISPOSABLE) ×4
IV NS 500ML (IV SOLUTION) ×2
IV NS 500ML BAXH (IV SOLUTION) ×2 IMPLANT
KIT TURNOVER KIT A (KITS) ×4 IMPLANT
LABEL OR SOLS (LABEL) ×4 IMPLANT
MANIFOLD NEPTUNE II (INSTRUMENTS) ×4 IMPLANT
MARGIN MAP 10MM (MISCELLANEOUS) ×4 IMPLANT
NEEDLE HYPO 25X1 1.5 SAFETY (NEEDLE) ×4 IMPLANT
NS IRRIG 500ML POUR BTL (IV SOLUTION) ×4 IMPLANT
PACK BASIN MINOR ARMC (MISCELLANEOUS) ×4 IMPLANT
PACK PORT-A-CATH (MISCELLANEOUS) IMPLANT
RETRACTOR RING XSMALL (MISCELLANEOUS) ×2 IMPLANT
RTRCTR WOUND ALEXIS 13CM XS SH (MISCELLANEOUS) ×4
SHEARS FOC LG CVD HARMONIC 17C (MISCELLANEOUS) ×4 IMPLANT
STRIP CLOSURE SKIN 1/2X4 (GAUZE/BANDAGES/DRESSINGS) ×3 IMPLANT
SUT ETHILON 3-0 FS-10 30 BLK (SUTURE) ×4
SUT MON AB 5-0 P3 18 (SUTURE) ×4 IMPLANT
SUT PROLENE 3 0 SH DA (SUTURE) IMPLANT
SUT SILK 2 0 (SUTURE)
SUT SILK 2-0 30XBRD TIE 12 (SUTURE) IMPLANT
SUT VIC AB 0 CT1 36 (SUTURE) ×4 IMPLANT
SUT VIC AB 2-0 CT1 27 (SUTURE) ×4
SUT VIC AB 2-0 CT1 TAPERPNT 27 (SUTURE) ×4 IMPLANT
SUT VIC AB 3-0 54X BRD REEL (SUTURE) ×2 IMPLANT
SUT VIC AB 3-0 BRD 54 (SUTURE) ×2
SUT VIC AB 3-0 SH 27 (SUTURE)
SUT VIC AB 3-0 SH 27X BRD (SUTURE) IMPLANT
SUT VIC AB 4-0 FS2 27 (SUTURE) ×4 IMPLANT
SUT VIC AB 4-0 PS2 18 (SUTURE) IMPLANT
SUTURE EHLN 3-0 FS-10 30 BLK (SUTURE) ×2 IMPLANT
SWABSTK COMLB BENZOIN TINCTURE (MISCELLANEOUS) ×4 IMPLANT
SYR 10ML LL (SYRINGE) ×4 IMPLANT
SYR 10ML SLIP (SYRINGE) ×4 IMPLANT

## 2020-07-29 NOTE — Anesthesia Preprocedure Evaluation (Signed)
Anesthesia Evaluation  Patient identified by MRN, date of birth, ID band Patient awake    Reviewed: Allergy & Precautions, NPO status , Patient's Chart, lab work & pertinent test results  History of Anesthesia Complications (+) PONV and history of anesthetic complications  Airway Mallampati: II  TM Distance: >3 FB Neck ROM: Full    Dental no notable dental hx.    Pulmonary neg pulmonary ROS, neg sleep apnea, neg COPD,    breath sounds clear to auscultation- rhonchi (-) wheezing      Cardiovascular Exercise Tolerance: Good (-) hypertension(-) CAD, (-) Past MI, (-) Cardiac Stents and (-) CABG  Rhythm:Regular Rate:Normal - Systolic murmurs and - Diastolic murmurs    Neuro/Psych neg Seizures negative neurological ROS  negative psych ROS   GI/Hepatic Neg liver ROS, GERD  ,  Endo/Other  negative endocrine ROSneg diabetes  Renal/GU negative Renal ROS     Musculoskeletal  (+) Arthritis ,   Abdominal (+) - obese,   Peds  Hematology negative hematology ROS (+)   Anesthesia Other Findings Past Medical History: No date: Arthritis 12/20/2017: Breast mass, right     Comment:  PLEOMORPHIC LOBULAR CARCINOMA IN SITU No date: Cancer ([place])     Comment:  no hx of cancer per pt No date: Complication of anesthesia     Comment:  PT STATES THAT ANESTHESIA HAS ALWAYS USE THE ANESTHESIA               THAT IS GIVEN TO HEART PATIENTS No date: Fluttering heart No date: GERD (gastroesophageal reflux disease)     Comment:  OCC No date: Heart murmur No date: History of methicillin resistant staphylococcus aureus (MRSA) No date: PONV (postoperative nausea and vomiting)     Comment:  patient denies PONV 06/08/20 No date: Seasonal allergies No date: Thyroid nodule   Reproductive/Obstetrics                             Anesthesia Physical Anesthesia Plan  ASA: 2  Anesthesia Plan: General   Post-op Pain  Management:    Induction: Intravenous  PONV Risk Score and Plan: 3 and Ondansetron, Aprepitant, Dexamethasone and Midazolam  Airway Management Planned: LMA  Additional Equipment:   Intra-op Plan:   Post-operative Plan:   Informed Consent: I have reviewed the patients History and Physical, chart, labs and discussed the procedure including the risks, benefits and alternatives for the proposed anesthesia with the patient or authorized representative who has indicated his/her understanding and acceptance.     Dental advisory given  Plan Discussed with: CRNA and Anesthesiologist  Anesthesia Plan Comments:         Anesthesia Quick Evaluation

## 2020-07-29 NOTE — Op Note (Signed)
Preoperative diagnosis: Metastatic lobular carcinoma of the right breast, focal necrosis at the mastectomy incision.  Postoperative diagnosis: Same.  Operative procedure: 1) right axillary dissection and 2) debridement of the mastectomy incision.  Operating Surgeon: Hervey Ard, MD.  Anesthesia: General by LMA, Marcaine 0.25% with 1: 200,000 units of epinephrine, 30 cc with a pectoralis block.  Estimated blood loss: Less than 5 cc.  Clinical note: This 65 year old woman had been identified with invasive lobular carcinoma.  She had desired breast reconstruction and underwent a mastectomy, sentinel node biopsy and placement of a tissue expander below the pectoralis muscle in the recent past.  She was identified with multiple positive axillary nodes.  She is felt to be a candidate for axillary dissection.  She has had a 1 x 5 cm area on the medial aspect of the skin sparing mastectomy site (not involving the nipple areolar complex) that has had focal breakdown.  It was plan to debride this at the same time.  The patient received Ancef 2 g intravenously prior to the procedure.  SCD stockings for DVT prevention.  Operative note: The area was cleansed with Betadine solution and draped due to the open wound.  Ultrasound was used to use 20 cc of the above-mentioned local anesthetic for a pectoralis block.  The remaining 10 cc was infiltrated under ultrasound guidance below the area of superficial necrosis avoiding the underlying tissue expander.  The axilla was opened through a transverse incision above the level of the implant.  After the scant subcutaneous tissue was divided a extra small Alexis wound protector was placed.  A single blade of the Thompson retractor was used to elevate the tissue and just cephalad to the axillary vein.  The axillary vein was identified and then all contents from the chest wall medial and inferior to this were swept away from.  The thoracodorsal nerve artery and vein  bundle were identified and protected as well as the long thoracic nerve of Bell.  The axillary contents versus removed making use of the harmonic scalpel for hemostasis.  The intercostal brachial nerve was divided.  The volume of space in the axilla was very small and it was elected not to place a drain.  The axillary envelope was closed with a running 2-0 Vicryl suture as well as the scant layer of subcutaneous fat.  The skin was then closed with a running 4-0 Vicryl subcuticular suture.  Attention was turned towards the breast site.  Through an elliptical incision the nonhealing area was excised and the base sharply debrided.  Into small foci it appeared that the underlying implant was exposed.  The scant layer of tissue representing what was left of the pectoralis muscle and the subcutaneous fat was approximated with interrupted 3-0 Vicryl sutures.  The skin was then closed with a running 5-0 Monocryl suture.  Benzoin and Steri-Strips were applied to the axillary wound followed by Telfa and Tegaderm dressings to both sites.  The patient tolerated the procedure well and was taken recovery in stable condition.

## 2020-07-29 NOTE — Anesthesia Procedure Notes (Signed)
Procedure Name: LMA Insertion Date/Time: 07/29/2020 11:55 AM Performed by: Doreen Salvage, CRNA Pre-anesthesia Checklist: Patient identified, Patient being monitored, Timeout performed, Emergency Drugs available and Suction available Patient Re-evaluated:Patient Re-evaluated prior to induction Oxygen Delivery Method: Circle system utilized Preoxygenation: Pre-oxygenation with 100% oxygen Induction Type: IV induction Ventilation: Mask ventilation without difficulty LMA: LMA inserted LMA Size: 3.5 Tube type: Oral Number of attempts: 1 Placement Confirmation: positive ETCO2 and breath sounds checked- equal and bilateral Tube secured with: Tape Dental Injury: Teeth and Oropharynx as per pre-operative assessment

## 2020-07-29 NOTE — H&P (Signed)
Sarah Hurley [id number] [birth year]     HPI: 65 y/o woman with multiple (3/4) nodes positive after SLN exam.  For axillary dissection.  Original plan for port placement is cancelled. She will use peripheral access, if she decides to take advantage of adjuvant chemotherapy.  We discussed that while chemotherapy is less effective in lobular than anaplastic breast cancer, it is still of benefit.  She will have the opportunity for port placement at a later date if needed.  Notes from plastic surgery yesterday was reviewed: static wound at mastectomy site with desire for debridement, excision.   Medications Prior to Admission  Medication Sig Dispense Refill Last Dose   diazepam (VALIUM) 2 MG tablet Take 1 tablet (2 mg total) by mouth every 12 (twelve) hours as needed for muscle spasms. 20 tablet 0 07/28/2020   Olopatadine HCl (PATADAY OP) Place 1 drop into both eyes daily.   07/28/2020   OVER THE COUNTER MEDICATION Take 1 tablet by mouth daily. Chelated zinc with copper   07/28/2020   PAPAYA ENZYME PO Take 1 tablet by mouth daily as needed (indigestion).   07/28/2020   triamcinolone (NASACORT) 55 MCG/ACT AERO nasal inhaler Place 1 spray into the nose daily.   07/28/2020   triamcinolone cream (KENALOG) 0.1 % Apply 1 application topically daily as needed (itching).  1 07/28/2020   TYRVAYA 0.03 MG/ACT SOLN Place 1 puff into both nostrils 2 (two) times daily.   07/28/2020   ondansetron (ZOFRAN) 4 MG tablet Take 1 tablet (4 mg total) by mouth every 8 (eight) hours as needed for nausea or vomiting. 20 tablet 0 Not Taking   Allergies  Allergen Reactions   Pseudoephedrine Other (See Comments)    palpitations   Penicillins Rash        Past Medical History:  Diagnosis Date   Arthritis    Breast mass, right 12/20/2017   PLEOMORPHIC LOBULAR CARCINOMA IN SITU   Cancer (HCC)    no hx of cancer per pt   Complication of anesthesia    PT STATES THAT ANESTHESIA HAS ALWAYS USE THE ANESTHESIA THAT IS GIVEN TO  HEART PATIENTS   Fluttering heart    GERD (gastroesophageal reflux disease)    OCC   Heart murmur    History of methicillin resistant staphylococcus aureus (MRSA)    PONV (postoperative nausea and vomiting)    patient denies PONV 06/08/20   Seasonal allergies    Thyroid nodule    Past Surgical History:  Procedure Laterality Date   BREAST BIOPSY Right 12/20/2017   stereo bx calcs/x clip/ PLEOMORPHIC LOBULAR CARCINOMA IN SITU   BREAST BIOPSY Right 04/09/2020   u/s bx path pending 10:00 6cmfn Q clip   BREAST CYST ASPIRATION Bilateral 2003   neg   BREAST LUMPECTOMY Right 03/26/2018   LCIS   BREAST LUMPECTOMY WITH NEEDLE LOCALIZATION Right 03/26/2018   Procedure: RIGHT BREAST WIDE EXCISION WITH NEEDLE LOCALIZATION;  Surgeon: Robert Bellow, MD;  Location: [facility];  Service: General;  Laterality: Right;   BREAST RECONSTRUCTION WITH PLACEMENT OF TISSUE EXPANDER AND FLEX HD (ACELLULAR HYDRATED DERMIS) Right 06/17/2020   Procedure: IMMEDIATE RIGHT BREAST RECONSTRUCTION WITH PLACEMENT OF TISSUE EXPANDER AND FLEX HD (ACELLULAR HYDRATED DERMIS);  Surgeon: Wallace Going, DO;  Location: [facility];  Service: Plastics;  Laterality: Right;   COLONOSCOPY  2015   EYE SURGERY     BIL   SIMPLE MASTECTOMY WITH AXILLARY SENTINEL NODE BIOPSY Right 06/17/2020   Procedure: SIMPLE MASTECTOMY WITH AXILLARY SENTINEL  NODE BIOPSY;  Surgeon: Robert Bellow, MD;  Location: [facility];  Service: General;  Laterality: Right;   TONSILLECTOMY     Social History   Socioeconomic History   Marital status: Married    Spouse name: Not on file   Number of children: Not on file   Years of education: Not on file   Highest education level: Not on file  Occupational History   Not on file  Tobacco Use   Smoking status: Never   Smokeless tobacco: Never  Vaping Use   Vaping Use: Never used  Substance and Sexual Activity   Alcohol use: Yes    Alcohol/week: 14.0 standard drinks    Types: 14 Shots of liquor per  week    Comment: couple of drinks/day   Drug use: No   Sexual activity: Not on file  Other Topics Concern   Not on file  Social History Narrative   Not on file   Social Determinants of Health   Financial Resource Strain: Not on file  Food Insecurity: Not on file  Transportation Needs: Not on file  Physical Activity: Not on file  Stress: Not on file  Social Connections: Not on file  Intimate Partner Violence: Not on file   Social History   Social History Narrative   Not on file     ROS: Negative.     PE: HEENT: Negative. Lungs: Clear. Cardio: RR. Right breast: 1 x 5 cm area of wound separation, no erythema, proteinaceous base.  Assessment/Plan:  Proceed with planned axillary dissection, wound debridement/ excision with primary closure.  Sarah Hurley [facility] 07/29/2020

## 2020-07-29 NOTE — Transfer of Care (Signed)
Immediate Anesthesia Transfer of Care Note  Patient: Sarah Hurley  Procedure(s) Performed: AXILLARY LYMPH NODE DISSECTION (Right)  Patient Location: PACU  Anesthesia Type:General  Level of Consciousness: awake, alert , oriented and patient cooperative  Airway & Oxygen Therapy: Patient Spontanous Breathing  Post-op Assessment: Report given to RN and Post -op Vital signs reviewed and stable  Post vital signs: Reviewed and stable  Last Vitals:  Vitals Value Taken Time  BP    Temp    Pulse 110 07/29/20 1341  Resp 21 07/29/20 1341  SpO2 98 % 07/29/20 1341    Last Pain:  Vitals:   07/29/20 1001  PainSc: 0-No pain         Complications: No notable events documented.

## 2020-07-30 ENCOUNTER — Encounter: Payer: Self-pay | Admitting: General Surgery

## 2020-07-30 NOTE — Anesthesia Postprocedure Evaluation (Signed)
Anesthesia Post Note  Patient: Sarah Hurley  Procedure(s) Performed: AXILLARY LYMPH NODE DISSECTION (Right)  Patient location during evaluation: PACU Anesthesia Type: General Level of consciousness: awake and alert Pain management: pain level controlled Vital Signs Assessment: post-procedure vital signs reviewed and stable Respiratory status: spontaneous breathing, nonlabored ventilation, respiratory function stable and patient connected to nasal cannula oxygen Cardiovascular status: blood pressure returned to baseline and stable Postop Assessment: no apparent nausea or vomiting Anesthetic complications: no   No notable events documented.   Last Vitals:  Vitals:   07/29/20 1429 07/29/20 1449  BP: 138/67 127/73  Pulse: 79 83  Resp: 16 16  Temp: (!) 36.3 C   SpO2: 98% 98%    Last Pain:  Vitals:   07/29/20 1449  TempSrc:   PainSc: 0-No pain                 Molli Barrows

## 2020-07-31 LAB — SURGICAL PATHOLOGY

## 2020-08-14 ENCOUNTER — Ambulatory Visit (INDEPENDENT_AMBULATORY_CARE_PROVIDER_SITE_OTHER): Payer: 59 | Admitting: Plastic Surgery

## 2020-08-14 ENCOUNTER — Encounter: Payer: Self-pay | Admitting: Plastic Surgery

## 2020-08-14 ENCOUNTER — Other Ambulatory Visit: Payer: Self-pay

## 2020-08-14 DIAGNOSIS — C50411 Malignant neoplasm of upper-outer quadrant of right female breast: Secondary | ICD-10-CM

## 2020-08-14 NOTE — Progress Notes (Signed)
The patient is a 65 year old female here with her husband for follow-up on her repeat excision by Dr. Bary Castilla.  The area appears to be healing very nicely she is quite sore today.  I do not want to push her healing as we noticed some healing issues before.  So we will not expand today.  We will plan on  expanding next week.  The patient is in agreement.

## 2020-08-17 NOTE — Addendum Note (Signed)
Addended by: Harl Bowie on: 08/17/2020 03:26 PM   Modules accepted: Orders

## 2020-08-18 NOTE — Progress Notes (Signed)
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  INTERVAL HISTORY: Patient returns to clinic today for further evaluation and treatment planning.  She was last evaluated in March 2022.  She recently underwent simple mastectomy, but was noted to have 3 lymph nodes positive for disease.  She tolerated her treatment well without significant side effects.  She is currently undergoing reconstruction and has an appointment with plastic surgery later this week. She has no neurologic complaints.  She denies any recent fevers or illnesses.  She has good appetite and denies weight loss.  She has no chest pain, shortness of breath, cough, or hemoptysis.  She denies any nausea, vomiting, constipation, or diarrhea.  She has no urinary complaints.  Patient offers no further specific complaints today.  REVIEW OF SYSTEMS:   Review of Systems  Constitutional: Negative.  Negative for fever, malaise/fatigue and weight loss.  Respiratory: Negative.  Negative for cough and hemoptysis.   Cardiovascular: Negative.  Negative for chest pain and leg swelling.  Gastrointestinal: Negative.  Negative for abdominal pain.  Genitourinary: Negative.   Musculoskeletal: Negative.  Negative for back pain.  Skin: Negative.  Negative for rash.  Neurological: Negative.  Negative for dizziness, focal weakness, weakness and headaches.  Psychiatric/Behavioral: Negative.  The patient is not nervous/anxious.    As per HPI. Otherwise, a complete review of systems is negative.  PAST MEDICAL HISTORY: Past Medical History:  Diagnosis Date   Arthritis    Breast mass, right 12/20/2017   PLEOMORPHIC  LOBULAR CARCINOMA IN SITU   Cancer ([place])    no hx of cancer per pt   Complication of anesthesia    PT STATES THAT ANESTHESIA HAS ALWAYS USE THE ANESTHESIA THAT IS GIVEN TO HEART PATIENTS   Fluttering heart    GERD (gastroesophageal reflux disease)    OCC   Heart murmur    History of methicillin resistant staphylococcus aureus (MRSA)    PONV (postoperative nausea and vomiting)    patient denies PONV 06/08/20   Seasonal allergies    Thyroid nodule     PAST SURGICAL HISTORY: Past Surgical History:  Procedure Laterality Date   AXILLARY LYMPH NODE DISSECTION Right 07/29/2020   Procedure: AXILLARY LYMPH NODE DISSECTION;  Surgeon: Robert Bellow, MD;  Location: [facility];  Service: General;  Laterality: Right;  (no SLN)   BREAST BIOPSY Right 12/20/2017   stereo bx calcs/x clip/ PLEOMORPHIC LOBULAR CARCINOMA IN SITU   BREAST BIOPSY Right 04/09/2020   u/s bx path pending 10:00 6cmfn Q clip   BREAST CYST ASPIRATION Bilateral 2003   neg   BREAST LUMPECTOMY Right 03/26/2018   LCIS   BREAST LUMPECTOMY WITH NEEDLE LOCALIZATION Right 03/26/2018   Procedure: RIGHT BREAST WIDE EXCISION WITH NEEDLE LOCALIZATION;  Surgeon: Robert Bellow, MD;  Location: [facility];  Service: General;  Laterality: Right;   BREAST RECONSTRUCTION WITH PLACEMENT OF TISSUE EXPANDER AND FLEX HD (ACELLULAR HYDRATED DERMIS) Right 06/17/2020   Procedure: IMMEDIATE RIGHT BREAST RECONSTRUCTION WITH PLACEMENT OF TISSUE EXPANDER AND FLEX HD (ACELLULAR HYDRATED DERMIS);  Surgeon: Wallace Going, DO;  Location: [facility];  Service: Plastics;  Laterality: Right;   COLONOSCOPY  2015   EYE SURGERY  BIL   SIMPLE MASTECTOMY WITH AXILLARY SENTINEL NODE BIOPSY Right 06/17/2020   Procedure: SIMPLE MASTECTOMY WITH AXILLARY SENTINEL NODE BIOPSY;  Surgeon: Earline Mayotte, MD;  Location: [facility];  Service: General;  Laterality: Right;   TONSILLECTOMY      FAMILY HISTORY: Family History  Problem Relation Age of Onset    Hypertension Mother    Heart disease Father    Breast cancer Sister     ADVANCED DIRECTIVES (Y/N):  N  HEALTH MAINTENANCE: Social History   Tobacco Use   Smoking status: Never   Smokeless tobacco: Never  Vaping Use   Vaping Use: Never used  Substance Use Topics   Alcohol use: Yes    Alcohol/week: 14.0 standard drinks    Types: 14 Shots of liquor per week    Comment: couple of drinks/day   Drug use: No     Colonoscopy:  PAP:  Bone density:  Lipid panel:  Allergies  Allergen Reactions   Pseudoephedrine Other (See Comments)    palpitations   Penicillins Rash         Current Outpatient Medications  Medication Sig Dispense Refill   magnesium 30 MG tablet Take 30 mg by mouth 2 (two) times daily.     Olopatadine HCl (PATADAY OP) Place 1 drop into both eyes daily.     OVER THE COUNTER MEDICATION Take 1 tablet by mouth daily. Chelated zinc with copper     PAPAYA ENZYME PO Take 1 tablet by mouth daily as needed (indigestion).     triamcinolone (NASACORT) 55 MCG/ACT AERO nasal inhaler Place 1 spray into the nose daily.     triamcinolone cream (KENALOG) 0.1 % Apply 1 application topically daily as needed (itching).  1   TYRVAYA 0.03 MG/ACT SOLN Place 1 puff into both nostrils 2 (two) times daily.     diazepam (VALIUM) 2 MG tablet Take 1 tablet (2 mg total) by mouth every 12 (twelve) hours as needed for muscle spasms. (Patient not taking: No sig reported) 20 tablet 0   No current facility-administered medications for this visit.    OBJECTIVE: Vitals:   08/19/20 0904  BP: 134/79  Pulse: 90  Temp: (!) 97.1 F (36.2 C)  SpO2: 100%     Body mass index is 17.89 kg/m.    ECOG FS:0 - Asymptomatic  General: Well-developed, well-nourished, no acute distress. Eyes: Pink conjunctiva, anicteric sclera. HEENT: Normocephalic, moist mucous membranes. Breasts: Right mastectomy Lungs: No audible wheezing or coughing. Heart: Regular rate and rhythm. Abdomen: Soft, nontender, no  obvious distention. Musculoskeletal: No edema, cyanosis, or clubbing. Neuro: Alert, answering all questions appropriately. Cranial nerves grossly intact. Skin: No rashes or petechiae noted. Psych: Normal affect.    LAB RESULTS:  Lab Results  Component Value Date   NA 140 06/04/2015   K 3.4 (L) 06/04/2015   CL 102 06/04/2015   CO2 25 06/04/2015   GLUCOSE 105 (H) 06/04/2015   BUN 18 06/04/2015   CREATININE 0.93 06/04/2015   CALCIUM 10.2 06/04/2015   PROT 7.7 06/04/2015   ALBUMIN 5.0 06/04/2015   AST 30 06/04/2015   ALT 18 06/04/2015   ALKPHOS 49 06/04/2015   BILITOT 2.3 (H) 06/04/2015   GFRNONAA >60 06/04/2015   GFRAA >60 06/04/2015    Lab Results  Component Value Date   WBC 5.9 06/04/2015   NEUTROABS 3.4 06/04/2015   HGB 18.4 (H) 06/04/2015   HCT 54.5 (H) 06/04/2015   MCV 97.4 06/04/2015   PLT 196 06/04/2015  STUDIES: No results found.   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   PLAN:    1. 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  She did not undergo any adjuvant treatment with letrozole or XRT at that time.  Patient subsequently had local recurrence and underwent simple mastectomy on Jun 17, 2020.  She then underwent axillary dissection on July 29, 2020 and noted to have 3 lymph nodes positive.  Although she had full mastectomy, given her positive axillary disease have referred to radiation oncology to seek their opinion on whether adjuvant XRT is necessary.  Unclear if adjuvant chemotherapy is necessary and Oncotype score is pending at time of dictation.  Have also recommended letrozole for a total of 5 years.  Patient have video assisted telemedicine visit in 2 to 3 weeks for further evaluation and final treatment planning.    I spent a total of 30 minutes  reviewing chart data, face-to-face evaluation with the patient, counseling and coordination of care as detailed above.    Patient expressed understanding and was in agreement with this plan. She also understands that She can call clinic at any time with any questions, concerns, or complaints.   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

## 2020-08-19 ENCOUNTER — Telehealth: Payer: Self-pay

## 2020-08-19 ENCOUNTER — Inpatient Hospital Stay: Payer: 59 | Attending: Oncology | Admitting: Oncology

## 2020-08-19 ENCOUNTER — Encounter: Payer: Self-pay | Admitting: Oncology

## 2020-08-19 VITALS — BP 134/79 | HR 90 | Temp 97.1°F | Wt 101.0 lb

## 2020-08-19 DIAGNOSIS — Z170 Estrogen receptor positive status [ER+]: Secondary | ICD-10-CM | POA: Insufficient documentation

## 2020-08-19 DIAGNOSIS — Z880 Allergy status to penicillin: Secondary | ICD-10-CM | POA: Insufficient documentation

## 2020-08-19 DIAGNOSIS — Z8249 Family history of ischemic heart disease and other diseases of the circulatory system: Secondary | ICD-10-CM | POA: Diagnosis not present

## 2020-08-19 DIAGNOSIS — Z8614 Personal history of Methicillin resistant Staphylococcus aureus infection: Secondary | ICD-10-CM | POA: Insufficient documentation

## 2020-08-19 DIAGNOSIS — C50411 Malignant neoplasm of upper-outer quadrant of right female breast: Secondary | ICD-10-CM | POA: Diagnosis present

## 2020-08-19 DIAGNOSIS — Z803 Family history of malignant neoplasm of breast: Secondary | ICD-10-CM | POA: Diagnosis not present

## 2020-08-19 DIAGNOSIS — Z7289 Other problems related to lifestyle: Secondary | ICD-10-CM | POA: Diagnosis not present

## 2020-08-19 DIAGNOSIS — Z79899 Other long term (current) drug therapy: Secondary | ICD-10-CM | POA: Diagnosis not present

## 2020-08-19 NOTE — Progress Notes (Signed)
No new concerns today 

## 2020-08-20 ENCOUNTER — Ambulatory Visit (INDEPENDENT_AMBULATORY_CARE_PROVIDER_SITE_OTHER): Payer: 59 | Admitting: Plastic Surgery

## 2020-08-20 ENCOUNTER — Other Ambulatory Visit: Payer: Self-pay

## 2020-08-20 ENCOUNTER — Encounter: Payer: Self-pay | Admitting: Plastic Surgery

## 2020-08-20 DIAGNOSIS — C50411 Malignant neoplasm of upper-outer quadrant of right female breast: Secondary | ICD-10-CM

## 2020-08-20 DIAGNOSIS — Z9011 Acquired absence of right breast and nipple: Secondary | ICD-10-CM

## 2020-08-20 DIAGNOSIS — Z9010 Acquired absence of unspecified breast and nipple: Secondary | ICD-10-CM | POA: Insufficient documentation

## 2020-08-20 DIAGNOSIS — D0501 Lobular carcinoma in situ of right breast: Secondary | ICD-10-CM

## 2020-08-20 NOTE — Progress Notes (Signed)
   Subjective:    Patient ID: Sarah Hurley, female    DOB: 1955/03/14, 65 y.o.   MRN: [id number]  The patient is a 65 year old female here for follow-up on her right breast surgery.  She had right breast cancer and underwent a mastectomy.  She had a expander and Flex HD placed at the time of her mastectomy.  She then had to go for further surgery with Dr. Bary Castilla.  This is her second visit after that surgery.  She is doing much better her skin looks better.  Her incision is healing nicely.  She might need radiation so we will need to expand her quickly.  She says that she is ready for expansion today and is doing well.     Review of Systems  Constitutional: Negative.   Eyes: Negative.   Respiratory: Negative.    Cardiovascular: Negative.  Negative for leg swelling.  Genitourinary: Negative.   Skin:  Negative for color change, rash and wound.      Objective:   Physical Exam Vitals and nursing note reviewed.  Constitutional:      Appearance: Normal appearance.  Cardiovascular:     Rate and Rhythm: Normal rate.     Pulses: Normal pulses.  Pulmonary:     Effort: Pulmonary effort is normal.  Skin:    Capillary Refill: Capillary refill takes less than 2 seconds.  Neurological:     Mental Status: She is alert. Mental status is at baseline.       Assessment & Plan:     ICD-10-CM   1. Carcinoma of upper-outer quadrant of female breast, right ([place])  C50.411     2. 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 Acquired absence of right breast  Z90.11       She tolerated the expansion extremely well.  I would like to expand her quickly so we will see her back in 10 days.  If she has any pain or tightness she knows to use either the Tylenol, Motrin or Valium. We placed injectable saline in the Expander using a sterile technique: Right: 70 cc for a total of 220 / 300 cc

## 2020-08-21 ENCOUNTER — Ambulatory Visit: Payer: 59 | Admitting: Plastic Surgery

## 2020-08-24 ENCOUNTER — Ambulatory Visit: Payer: 59 | Admitting: Radiation Oncology

## 2020-08-25 NOTE — Telephone Encounter (Signed)
Request was submitted on the lymph node specimen.    New Oncotype order (order # [id number]) submitted on the breast specimen (Case: [id number]).  Order also faxed to [facility] pathology department.

## 2020-08-30 NOTE — Progress Notes (Signed)
  [place]  Telephone:(336) [phone] Fax:(336) [phone]  ID: DORETHY LALIBERTE OB: [birth year]  MR#: [id number]  [id number]  Patient Care Team: Maryland Pink, MD as PCP - General (Family Medicine) Theodore Demark, RN as Oncology Nurse Navigator    Lloyd Huger, MD   09/04/2020 8:31 AM    This encounter was created in error - please disregard.

## 2020-09-01 ENCOUNTER — Other Ambulatory Visit: Payer: Self-pay

## 2020-09-01 ENCOUNTER — Encounter: Payer: Self-pay | Admitting: Plastic Surgery

## 2020-09-01 ENCOUNTER — Ambulatory Visit (INDEPENDENT_AMBULATORY_CARE_PROVIDER_SITE_OTHER): Payer: 59 | Admitting: Plastic Surgery

## 2020-09-01 DIAGNOSIS — Z9011 Acquired absence of right breast and nipple: Secondary | ICD-10-CM

## 2020-09-01 DIAGNOSIS — C50411 Malignant neoplasm of upper-outer quadrant of right female breast: Secondary | ICD-10-CM

## 2020-09-01 DIAGNOSIS — R2231 Localized swelling, mass and lump, right upper limb: Secondary | ICD-10-CM

## 2020-09-01 NOTE — Progress Notes (Addendum)
   Subjective:    Patient ID: Sarah Hurley, female    DOB: [birth year], 65 y.o.   MRN: [id number]  The patient is a 65 year old female here for follow-up on her right breast reconstruction.  She had a mastectomy with expander placement.  She needed lymph nodes and so had an additional surgery with Dr. Bary Castilla.  She is doing really well and seems to be healing nicely.  She is about even.  Working to do another fill today.  Radiation is still pending.  It would be nice to get her exchanged to soon as possible.  She also complains about a tender nodule on her right inner arm.  Upon palpation it is fairly firm and seems      Review of Systems  Constitutional: Negative.   Eyes: Negative.   Respiratory: Negative.    Cardiovascular: Negative.   Genitourinary: Negative.       Objective:   Physical Exam Constitutional:      Appearance: Normal appearance.  Cardiovascular:     Rate and Rhythm: Normal rate.     Pulses: Normal pulses.  Pulmonary:     Effort: Pulmonary effort is normal.  Abdominal:     General: Abdomen is flat.  Neurological:     Mental Status: She is alert. Mental status is at baseline.        Assessment & Plan:     ICD-10-CM   1. Acquired absence of right breast  Z90.11     2. Carcinoma of upper-outer quadrant of female breast, right ([place])  C50.411     3. Arm mass, right  R22.31     I would like to see her back in 2 weeks for likely be her last fill.  We will go ahead and get her scheduled for probably the end of September for: Right breast expander exchange and left mastopexy.  We placed injectable saline in the Expander using a sterile technique: Right: 50 cc for a total of 270 / 300 cc  U/S for right arm.  Pictures were obtained of the patient and placed in the chart with the patient's or guardian's permission.

## 2020-09-03 ENCOUNTER — Inpatient Hospital Stay: Payer: 59 | Admitting: Oncology

## 2020-09-03 ENCOUNTER — Encounter: Payer: Self-pay | Admitting: Oncology

## 2020-09-03 ENCOUNTER — Other Ambulatory Visit: Payer: Self-pay | Admitting: Surgical

## 2020-09-03 DIAGNOSIS — R2231 Localized swelling, mass and lump, right upper limb: Secondary | ICD-10-CM

## 2020-09-03 DIAGNOSIS — C50411 Malignant neoplasm of upper-outer quadrant of right female breast: Secondary | ICD-10-CM

## 2020-09-03 NOTE — Addendum Note (Signed)
Addended by: Wallace Going on: 09/03/2020 10:42 AM   Modules accepted: Orders

## 2020-09-04 ENCOUNTER — Encounter: Payer: Self-pay | Admitting: Oncology

## 2020-09-04 NOTE — Telephone Encounter (Signed)
Oncotype results are now available.  Please r/s appt for next week to discuss with MD.  Thanks!

## 2020-09-08 ENCOUNTER — Encounter: Payer: Self-pay | Admitting: Oncology

## 2020-09-08 ENCOUNTER — Inpatient Hospital Stay: Payer: 59 | Attending: Oncology | Admitting: Oncology

## 2020-09-08 ENCOUNTER — Other Ambulatory Visit: Payer: Self-pay

## 2020-09-08 DIAGNOSIS — C50411 Malignant neoplasm of upper-outer quadrant of right female breast: Secondary | ICD-10-CM | POA: Diagnosis not present

## 2020-09-08 NOTE — Progress Notes (Signed)
[place]  [phone] [phone]  ID: Sarah Hurley OB: 1955-07-31  MR#: [id number]  IRS#:[id number]  Patient Care Team: [place] Pink, MD as PCP - General (Family Medicine) Theodore Demark, RN as Oncology Nurse Navigator  I connected with Sarah Hurley on 09/08/20 at  1:00 PM EDT by video enabled telemedicine visit and verified that I am speaking with the correct person using two identifiers.   I discussed the limitations, risks, security and privacy concerns of performing an evaluation and management service by telemedicine and the availability of in-person appointments. I also discussed with the patient that there may be a patient responsible charge related to this service. The patient expressed understanding and agreed to proceed.   Other persons participating in the visit and their role in the encounter: Patient, patient's husband, MD.  Patient's location: Home. Provider's location: Clinic.  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  Oncotype score 14, low risk.  INTERVAL HISTORY: Patient agreed to video assisted telemedicine visit for further evaluation, discussion of her Oncotype score, and treatment planning.  She currently feels well and is asymptomatic. She is currently undergoing reconstruction and has an appointment with plastic surgery in the near future. She has no neurologic complaints.  She denies any recent fevers or illnesses.  She has good appetite and denies weight loss.  She has no chest pain, shortness of breath, cough, or hemoptysis.  She denies any nausea, vomiting, constipation, or diarrhea.  She has no urinary complaints.  Patient offers no specific complaints today.  REVIEW OF SYSTEMS:   Review of Systems  Constitutional: Negative.  Negative for fever, malaise/fatigue and weight loss.  Respiratory: Negative.  Negative for cough and hemoptysis.    Cardiovascular: Negative.  Negative for chest pain and leg swelling.  Gastrointestinal: Negative.  Negative for abdominal pain.  Genitourinary: Negative.   Musculoskeletal: Negative.  Negative for back pain.  Skin: Negative.  Negative for rash.  Neurological: Negative.  Negative for dizziness, focal weakness, weakness and headaches.  Psychiatric/Behavioral: Negative.  The patient is not nervous/anxious.    As per HPI. Otherwise, a complete review of systems is negative.  PAST MEDICAL HISTORY: Past Medical History:  Diagnosis Date   Arthritis    Breast mass, right 12/20/2017   PLEOMORPHIC LOBULAR CARCINOMA IN SITU   Cancer ([place])    no hx of cancer per pt   Complication of anesthesia    PT STATES THAT ANESTHESIA HAS ALWAYS USE THE ANESTHESIA THAT IS GIVEN TO HEART PATIENTS   Fluttering heart    GERD (gastroesophageal reflux disease)    OCC   Heart murmur    History of methicillin resistant staphylococcus aureus (MRSA)    PONV (postoperative nausea and vomiting)    patient denies PONV 06/08/20   Seasonal allergies    Thyroid nodule     PAST SURGICAL HISTORY: Past Surgical History:  Procedure Laterality Date   AXILLARY LYMPH NODE DISSECTION Right 07/29/2020   Procedure: AXILLARY LYMPH NODE DISSECTION;  Surgeon: Robert Bellow, MD;  Location: [facility];  Service: General;  Laterality: Right;  (no SLN)   BREAST BIOPSY Right 12/20/2017   stereo bx calcs/x clip/ PLEOMORPHIC LOBULAR CARCINOMA IN SITU   BREAST BIOPSY Right 04/09/2020   u/s bx path pending 10:00 6cmfn Q clip   BREAST CYST ASPIRATION Bilateral 2003   neg   BREAST LUMPECTOMY Right 03/26/2018   LCIS   BREAST LUMPECTOMY  WITH NEEDLE LOCALIZATION Right 03/26/2018   Procedure: RIGHT BREAST WIDE EXCISION WITH NEEDLE LOCALIZATION;  Surgeon: Robert Bellow, MD;  Location: [facility];  Service: General;  Laterality: Right;   BREAST RECONSTRUCTION WITH PLACEMENT OF TISSUE EXPANDER AND FLEX HD (ACELLULAR HYDRATED DERMIS)  Right 06/17/2020   Procedure: IMMEDIATE RIGHT BREAST RECONSTRUCTION WITH PLACEMENT OF TISSUE EXPANDER AND FLEX HD (ACELLULAR HYDRATED DERMIS);  Surgeon: Wallace Going, DO;  Location: [facility];  Service: Plastics;  Laterality: Right;   COLONOSCOPY  2015   EYE SURGERY     BIL   SIMPLE MASTECTOMY WITH AXILLARY SENTINEL NODE BIOPSY Right 06/17/2020   Procedure: SIMPLE MASTECTOMY WITH AXILLARY SENTINEL NODE BIOPSY;  Surgeon: Robert Bellow, MD;  Location: [facility];  Service: General;  Laterality: Right;   TONSILLECTOMY      FAMILY HISTORY: Family History  Problem Relation Age of Onset   Hypertension Mother    Heart disease Father    Breast cancer Sister     ADVANCED DIRECTIVES (Y/N):  N  HEALTH MAINTENANCE: Social History   Tobacco Use   Smoking status: Never   Smokeless tobacco: Never  Vaping Use   Vaping Use: Never used  Substance Use Topics   Alcohol use: Yes    Alcohol/week: 14.0 standard drinks    Types: 14 Shots of liquor per week    Comment: couple of drinks/day   Drug use: No     Colonoscopy:  PAP:  Bone density:  Lipid panel:  Allergies  Allergen Reactions   Pseudoephedrine Other (See Comments)    palpitations   Penicillins Rash         Current Outpatient Medications  Medication Sig Dispense Refill   magnesium 30 MG tablet Take 30 mg by mouth 2 (two) times daily.     Olopatadine HCl (PATADAY OP) Place 1 drop into both eyes daily.     OVER THE COUNTER MEDICATION Take 1 tablet by mouth daily. Chelated zinc with copper     PAPAYA ENZYME PO Take 1 tablet by mouth daily as needed (indigestion).     triamcinolone (NASACORT) 55 MCG/ACT AERO nasal inhaler Place 1 spray into the nose daily.     TYRVAYA 0.03 MG/ACT SOLN Place 1 puff into both nostrils 2 (two) times daily.     diazepam (VALIUM) 2 MG tablet Take 1 tablet (2 mg total) by mouth every 12 (twelve) hours as needed for muscle spasms. (Patient not taking: No sig reported) 20 tablet 0    triamcinolone cream (KENALOG) 0.1 % Apply 1 application topically daily as needed (itching). (Patient not taking: No sig reported)  1   No current facility-administered medications for this visit.    OBJECTIVE: There were no vitals filed for this visit.    There is no height or weight on file to calculate BMI.    ECOG FS:0 - Asymptomatic  General: Well-developed, well-nourished, no acute distress. HEENT: Normocephalic. Neuro: Alert, answering all questions appropriately. Cranial nerves grossly intact. Psych: Normal affect.   LAB RESULTS:  Lab Results  Component Value Date   NA 140 06/04/2015   K 3.4 (L) 06/04/2015   CL 102 06/04/2015   CO2 25 06/04/2015   GLUCOSE 105 (H) 06/04/2015   BUN 18 06/04/2015   CREATININE 0.93 06/04/2015   CALCIUM 10.2 06/04/2015   PROT 7.7 06/04/2015   ALBUMIN 5.0 06/04/2015   AST 30 06/04/2015   ALT 18 06/04/2015   ALKPHOS 49 06/04/2015   BILITOT 2.3 (H) 06/04/2015  GFRNONAA >60 06/04/2015   GFRAA >60 06/04/2015    Lab Results  Component Value Date   WBC 5.9 06/04/2015   NEUTROABS 3.4 06/04/2015   HGB 18.4 (H) 06/04/2015   HCT 54.5 (H) 06/04/2015   MCV 97.4 06/04/2015   PLT 196 06/04/2015     STUDIES: No results found.   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  Oncotype DX score 14, low risk.   PLAN:    1. 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  She did not undergo any adjuvant treatment with letrozole or XRT at that time.  Patient subsequently had local recurrence and underwent simple mastectomy on Jun 17, 2020 with 1 lymph node positive for disease at that time.  She then underwent axillary dissection on July 29, 2020 and noted to have 2 additional lymph nodes positive for disease for a total of 3.  Although she had full  mastectomy, given her positive axillary disease have referred to radiation oncology to seek their opinion on whether adjuvant XRT is necessary.  Adjuvant chemotherapy is not necessary given her low risk Oncotype score.  Have also recommended letrozole for a total of 5 years.  Patient will have video assisted telemedicine visit after her evaluation from radiation oncology to initiate letrozole. 2.  Bone health: Patient underwent bone mineral density on May 13, 2020 that revealed a T score of -0.2 which is considered normal.  Repeat in April 2024.  I provided 20 minutes of face-to-face video visit time during this encounter which included chart review, counseling, and coordination of care as documented above.     Patient expressed understanding and was in agreement with this plan. She also understands that She can call clinic at any time with any questions, concerns, or complaints.   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

## 2020-09-10 ENCOUNTER — Other Ambulatory Visit: Payer: 59

## 2020-09-15 ENCOUNTER — Encounter: Payer: Self-pay | Admitting: Physician Assistant

## 2020-09-15 ENCOUNTER — Ambulatory Visit (INDEPENDENT_AMBULATORY_CARE_PROVIDER_SITE_OTHER): Payer: 59 | Admitting: Physician Assistant

## 2020-09-15 ENCOUNTER — Ambulatory Visit: Payer: 59 | Admitting: Plastic Surgery

## 2020-09-15 ENCOUNTER — Other Ambulatory Visit: Payer: Self-pay

## 2020-09-15 VITALS — BP 151/90 | HR 83 | Ht 63.0 in | Wt 101.6 lb

## 2020-09-15 DIAGNOSIS — C50411 Malignant neoplasm of upper-outer quadrant of right female breast: Secondary | ICD-10-CM

## 2020-09-15 DIAGNOSIS — Z9011 Acquired absence of right breast and nipple: Secondary | ICD-10-CM

## 2020-09-15 MED ORDER — HYDROCODONE-ACETAMINOPHEN 5-325 MG PO TABS: 1.0000 | ORAL_TABLET | Freq: Four times a day (QID) | ORAL | 0 refills | Status: AC | PRN

## 2020-09-15 MED ORDER — ONDANSETRON 4 MG PO TBDP: 4.0000 mg | ORAL_TABLET | Freq: Three times a day (TID) | ORAL | 0 refills | Status: DC | PRN

## 2020-09-15 MED ORDER — CEPHALEXIN 500 MG PO CAPS: 500.0000 mg | ORAL_CAPSULE | Freq: Four times a day (QID) | ORAL | 0 refills | Status: AC

## 2020-09-15 NOTE — H&P (View-Only) (Signed)
Patient ID: Sarah Hurley, female    DOB: 12/02/1955, 65 y.o.   MRN: [id number]  Chief Complaint  Patient presents with   Pre-op Exam      ICD-10-CM   1. Acquired absence of right breast  Z90.11     2. Carcinoma of upper-outer quadrant of female breast, right [facility])  C50.411        History of Present Illness: Sarah Hurley is a 65 y.o.  female  with a history of right-sided breast cancer s/p mastectomy and expander placement.  She presents for preoperative evaluation for upcoming procedure, right breast expander removal and implant placement as well as left-sided mastopexy, tentatively scheduled for 10/12/2020 with Dr. Marla Roe.    The patient has not had problems with anesthesia.    Summary of Previous Visit: Patient was last seen here in the office on 09/01/2020 by Dr. Marla Roe for her right breast reconstruction.  She also complained of tender nodule on right inner arm.  [name] ordered.  Most recent surgical intervention was 07/29/2020, right axillary dissection debridement of mastectomy incision performed by Dr. Fleet Contras.  Right breast has 270/300 cc.  Job: Press photographer, works at a [employer], environmental.  Denies any significant physical component.  PMH Significant for: Right-sided breast cancer s/p mastectomy, thyroid disorder, hyperlipidemia.  She has not yet had any radiation.  She has a listed PCN allergy that she says is from her childhood.  She does not recall her reaction.     Past Medical History: Allergies: Allergies  Allergen Reactions   Pseudoephedrine Other (See Comments)    palpitations   Penicillins Rash         Current Medications:  Current Outpatient Medications:    diazepam (VALIUM) 2 MG tablet, Take 1 tablet (2 mg total) by mouth every 12 (twelve) hours as needed for muscle spasms., Disp: 20 tablet, Rfl: 0   magnesium 30 MG tablet, Take 30 mg by mouth 2 (two) times daily., Disp: , Rfl:    Olopatadine HCl (PATADAY OP), Place 1 drop into both eyes daily., Disp:  , Rfl:    OVER THE COUNTER MEDICATION, Take 1 tablet by mouth daily. Chelated zinc with copper, Disp: , Rfl:    PAPAYA ENZYME PO, Take 1 tablet by mouth daily as needed (indigestion)., Disp: , Rfl:    triamcinolone (NASACORT) 55 MCG/ACT AERO nasal inhaler, Place 1 spray into the nose daily., Disp: , Rfl:    triamcinolone cream (KENALOG) 0.1 %, Apply 1 application topically daily as needed (itching)., Disp: , Rfl: 1   TYRVAYA 0.03 MG/ACT SOLN, Place 1 puff into both nostrils 2 (two) times daily., Disp: , Rfl:   Past Medical Problems: Past Medical History:  Diagnosis Date   Arthritis    Breast mass, right 12/20/2017   PLEOMORPHIC LOBULAR CARCINOMA IN SITU   Cancer ([place])    no hx of cancer per pt   Complication of anesthesia    PT STATES THAT ANESTHESIA HAS ALWAYS USE THE ANESTHESIA THAT IS GIVEN TO HEART PATIENTS   Fluttering heart    GERD (gastroesophageal reflux disease)    OCC   Heart murmur    History of methicillin resistant staphylococcus aureus (MRSA)    PONV (postoperative nausea and vomiting)    patient denies PONV 06/08/20   Seasonal allergies    Thyroid nodule     Past Surgical History: Past Surgical History:  Procedure Laterality Date   AXILLARY LYMPH NODE DISSECTION Right 07/29/2020   Procedure: AXILLARY LYMPH  NODE DISSECTION;  Surgeon: Robert Bellow, MD;  Location: [facility];  Service: General;  Laterality: Right;  (no SLN)   BREAST BIOPSY Right 12/20/2017   stereo bx calcs/x clip/ PLEOMORPHIC LOBULAR CARCINOMA IN SITU   BREAST BIOPSY Right 04/09/2020   u/s bx path pending 10:00 6cmfn Q clip   BREAST CYST ASPIRATION Bilateral 2003   neg   BREAST LUMPECTOMY Right 03/26/2018   LCIS   BREAST LUMPECTOMY WITH NEEDLE LOCALIZATION Right 03/26/2018   Procedure: RIGHT BREAST WIDE EXCISION WITH NEEDLE LOCALIZATION;  Surgeon: Robert Bellow, MD;  Location: [facility];  Service: General;  Laterality: Right;   BREAST RECONSTRUCTION WITH PLACEMENT OF TISSUE EXPANDER AND  FLEX HD (ACELLULAR HYDRATED DERMIS) Right 06/17/2020   Procedure: IMMEDIATE RIGHT BREAST RECONSTRUCTION WITH PLACEMENT OF TISSUE EXPANDER AND FLEX HD (ACELLULAR HYDRATED DERMIS);  Surgeon: Wallace Going, DO;  Location: [facility];  Service: Plastics;  Laterality: Right;   COLONOSCOPY  2015   EYE SURGERY     BIL   SIMPLE MASTECTOMY WITH AXILLARY SENTINEL NODE BIOPSY Right 06/17/2020   Procedure: SIMPLE MASTECTOMY WITH AXILLARY SENTINEL NODE BIOPSY;  Surgeon: Robert Bellow, MD;  Location: [facility];  Service: General;  Laterality: Right;   TONSILLECTOMY      Social History: Social History   Socioeconomic History   Marital status: Married    Spouse name: Not on file   Number of children: Not on file   Years of education: Not on file   Highest education level: Not on file  Occupational History   Not on file  Tobacco Use   Smoking status: Never   Smokeless tobacco: Never  Vaping Use   Vaping Use: Never used  Substance and Sexual Activity   Alcohol use: Yes    Alcohol/week: 14.0 standard drinks    Types: 14 Shots of liquor per week    Comment: couple of drinks/day   Drug use: No   Sexual activity: Not on file  Other Topics Concern   Not on file  Social History Narrative   Not on file   Social Determinants of Health   Financial Resource Strain: Not on file  Food Insecurity: Not on file  Transportation Needs: Not on file  Physical Activity: Not on file  Stress: Not on file  Social Connections: Not on file  Intimate Partner Violence: Not on file    Family History: Family History  Problem Relation Age of Onset   Hypertension Mother    Heart disease Father    Breast cancer Sister     Review of Systems: Review of Systems  Musculoskeletal:  Positive for myalgias.       Tightness and mild discomfort in area of right pectoral region.    Skin:  Negative for rash.       Her painful bump on right arm has resolved.   Physical Exam: Vital Signs BP (!) 151/90 (BP  Location: Left Arm, Patient Position: Sitting, Cuff Size: Normal)   Pulse 83   Ht '5\' 3"'$  (1.6 m)   Wt 101 lb 9.6 oz (46.1 kg)   SpO2 97%   BMI 18.00 kg/m   Physical Exam Constitutional:      General: Not in acute distress.    Appearance: Normal appearance. Not ill-appearing.  HENT:     Head: Normocephalic and atraumatic.  Eyes:     Pupils: Pupils are equal, round Neck:     Musculoskeletal: Normal range of motion.  Cardiovascular:  Rate and Rhythm: Normal rate    Pulses: Normal pulses.  Pulmonary:     Effort: Pulmonary effort is normal. No respiratory distress.  Abdominal:     General: Abdomen is flat. There is no distension.  Musculoskeletal: Normal range of motion. No obvious nodule or tender bump on right arm as described during prior encounter. Skin:    General: Skin is warm and dry.     Findings: No erythema or rash.  Neurological:     General: No focal deficit present.     Mental Status: Alert and oriented to person, place, and time. Mental status is at baseline.     Motor: No weakness.  Psychiatric:        Mood and Affect: Mood normal.        Behavior: Behavior normal.    Assessment/Plan: The patient is scheduled tentatively for 10/12/2020 with Dr. Marla Roe.  She queried possibly moving it up to 09/30/2020 if available. Risks, benefits, and alternatives of procedure discussed, questions answered and consent obtained.    Smoking Status: Never Last Mammogram: 03/26/2020; Results: Known malignancy in upper outer quadrant, right breast.  BI-RADS 6.  Caprini Score: 6; Risk Factors include: age, history of cancer, gender, and length of planned surgery. Recommendation for mechanical prophylaxis. Encouraged early ambulation.   Pictures obtained: 09/01/2020.  Post-op Rx sent to pharmacy:   Patient was provided with the General Surgical Risk consent document and Pain Medication Agreement prior to their appointment.  They had adequate time to read through the risk consent  documents and Pain Medication Agreement. We also discussed them in person together during this preop appointment. All of their questions were answered to their satisfaction.  Recommended calling if they have any further questions.  Risk consent form and Pain Medication Agreement to be scanned into patient's chart.  The risks that can be encountered with and after placement of a breast implant placement were discussed and include the following but not limited to these: bleeding, infection, delayed healing, anesthesia risks, skin sensation changes, injury to structures including nerves, blood vessels, and muscles which may be temporary or permanent, allergies to tape, suture materials and glues, blood products, topical preparations or injected agents, skin contour irregularities, skin discoloration and swelling, deep vein thrombosis, cardiac and pulmonary complications, pain, which may persist, fluid accumulation, wrinkling of the skin over the implant, changes in nipple or breast sensation, implant leakage or rupture, faulty position of the implant, persistent pain, formation of tight scar tissue around the implant (capsular contracture), possible need for revisional surgery or staged procedures.     Electronically signed by: Krista Blue, PA-C 09/15/2020 11:47 AM

## 2020-09-15 NOTE — Progress Notes (Signed)
Patient ID: Sarah Hurley, female    DOB: 08-Jan-1956, 65 y.o.   MRN: [id number]  Chief Complaint  Patient presents with   Pre-op Exam      ICD-10-CM   1. Acquired absence of right breast  Z90.11     2. Carcinoma of upper-outer quadrant of female breast, right [facility])  C50.411        History of Present Illness: Sarah Hurley is a 65 y.o.  female  with a history of right-sided breast cancer s/p mastectomy and expander placement.  She presents for preoperative evaluation for upcoming procedure, right breast expander removal and implant placement as well as left-sided mastopexy, tentatively scheduled for 10/12/2020 with Dr. Marla Roe.    The patient has not had problems with anesthesia.    Summary of Previous Visit: Patient was last seen here in the office on 09/01/2020 by Dr. Marla Roe for her right breast reconstruction.  She also complained of tender nodule on right inner arm.  [name] ordered.  Most recent surgical intervention was 07/29/2020, right axillary dissection debridement of mastectomy incision performed by Dr. Fleet Contras.  Right breast has 270/300 cc.  Job: Press photographer, works at a [employer], environmental.  Denies any significant physical component.  PMH Significant for: Right-sided breast cancer s/p mastectomy, thyroid disorder, hyperlipidemia.  She has not yet had any radiation.  She has a listed PCN allergy that she says is from her childhood.  She does not recall her reaction.     Past Medical History: Allergies: Allergies  Allergen Reactions   Pseudoephedrine Other (See Comments)    palpitations   Penicillins Rash         Current Medications:  Current Outpatient Medications:    diazepam (VALIUM) 2 MG tablet, Take 1 tablet (2 mg total) by mouth every 12 (twelve) hours as needed for muscle spasms., Disp: 20 tablet, Rfl: 0   magnesium 30 MG tablet, Take 30 mg by mouth 2 (two) times daily., Disp: , Rfl:    Olopatadine HCl (PATADAY OP), Place 1 drop into both eyes daily., Disp:  , Rfl:    OVER THE COUNTER MEDICATION, Take 1 tablet by mouth daily. Chelated zinc with copper, Disp: , Rfl:    PAPAYA ENZYME PO, Take 1 tablet by mouth daily as needed (indigestion)., Disp: , Rfl:    triamcinolone (NASACORT) 55 MCG/ACT AERO nasal inhaler, Place 1 spray into the nose daily., Disp: , Rfl:    triamcinolone cream (KENALOG) 0.1 %, Apply 1 application topically daily as needed (itching)., Disp: , Rfl: 1   TYRVAYA 0.03 MG/ACT SOLN, Place 1 puff into both nostrils 2 (two) times daily., Disp: , Rfl:   Past Medical Problems: Past Medical History:  Diagnosis Date   Arthritis    Breast mass, right 12/20/2017   PLEOMORPHIC LOBULAR CARCINOMA IN SITU   Cancer ([place])    no hx of cancer per pt   Complication of anesthesia    PT STATES THAT ANESTHESIA HAS ALWAYS USE THE ANESTHESIA THAT IS GIVEN TO HEART PATIENTS   Fluttering heart    GERD (gastroesophageal reflux disease)    OCC   Heart murmur    History of methicillin resistant staphylococcus aureus (MRSA)    PONV (postoperative nausea and vomiting)    patient denies PONV 06/08/20   Seasonal allergies    Thyroid nodule     Past Surgical History: Past Surgical History:  Procedure Laterality Date   AXILLARY LYMPH NODE DISSECTION Right 07/29/2020   Procedure: AXILLARY LYMPH  NODE DISSECTION;  Surgeon: Robert Bellow, MD;  Location: [facility] ORS;  Service: General;  Laterality: Right;  (no SLN)   BREAST BIOPSY Right 12/20/2017   stereo bx calcs/x clip/ PLEOMORPHIC LOBULAR CARCINOMA IN SITU   BREAST BIOPSY Right 04/09/2020   u/s bx path pending 10:00 6cmfn Q clip   BREAST CYST ASPIRATION Bilateral 2003   neg   BREAST LUMPECTOMY Right 03/26/2018   LCIS   BREAST LUMPECTOMY WITH NEEDLE LOCALIZATION Right 03/26/2018   Procedure: RIGHT BREAST WIDE EXCISION WITH NEEDLE LOCALIZATION;  Surgeon: Robert Bellow, MD;  Location: [facility] ORS;  Service: General;  Laterality: Right;   BREAST RECONSTRUCTION WITH PLACEMENT OF TISSUE EXPANDER AND  FLEX HD (ACELLULAR HYDRATED DERMIS) Right 06/17/2020   Procedure: IMMEDIATE RIGHT BREAST RECONSTRUCTION WITH PLACEMENT OF TISSUE EXPANDER AND FLEX HD (ACELLULAR HYDRATED DERMIS);  Surgeon: Wallace Going, DO;  Location: [facility] ORS;  Service: Plastics;  Laterality: Right;   COLONOSCOPY  2015   EYE SURGERY     BIL   SIMPLE MASTECTOMY WITH AXILLARY SENTINEL NODE BIOPSY Right 06/17/2020   Procedure: SIMPLE MASTECTOMY WITH AXILLARY SENTINEL NODE BIOPSY;  Surgeon: Robert Bellow, MD;  Location: [facility] ORS;  Service: General;  Laterality: Right;   TONSILLECTOMY      Social History: Social History   Socioeconomic History   Marital status: Married    Spouse name: Not on file   Number of children: Not on file   Years of education: Not on file   Highest education level: Not on file  Occupational History   Not on file  Tobacco Use   Smoking status: Never   Smokeless tobacco: Never  Vaping Use   Vaping Use: Never used  Substance and Sexual Activity   Alcohol use: Yes    Alcohol/week: 14.0 standard drinks    Types: 14 Shots of liquor per week    Comment: couple of drinks/day   Drug use: No   Sexual activity: Not on file  Other Topics Concern   Not on file  Social History Narrative   Not on file   Social Determinants of Health   Financial Resource Strain: Not on file  Food Insecurity: Not on file  Transportation Needs: Not on file  Physical Activity: Not on file  Stress: Not on file  Social Connections: Not on file  Intimate Partner Violence: Not on file    Family History: Family History  Problem Relation Age of Onset   Hypertension Mother    Heart disease Father    Breast cancer Sister     Review of Systems: Review of Systems  Musculoskeletal:  Positive for myalgias.       Tightness and mild discomfort in area of right pectoral region.    Skin:  Negative for rash.       Her painful bump on right arm has resolved.   Physical Exam: Vital Signs BP (!) 151/90 (BP  Location: Left Arm, Patient Position: Sitting, Cuff Size: Normal)   Pulse 83   Ht '5\' 3"'$  (1.6 m)   Wt 101 lb 9.6 oz (46.1 kg)   SpO2 97%   BMI 18.00 kg/m   Physical Exam Constitutional:      General: Not in acute distress.    Appearance: Normal appearance. Not ill-appearing.  HENT:     Head: Normocephalic and atraumatic.  Eyes:     Pupils: Pupils are equal, round Neck:     Musculoskeletal: Normal range of motion.  Cardiovascular:  Rate and Rhythm: Normal rate    Pulses: Normal pulses.  Pulmonary:     Effort: Pulmonary effort is normal. No respiratory distress.  Abdominal:     General: Abdomen is flat. There is no distension.  Musculoskeletal: Normal range of motion. No obvious nodule or tender bump on right arm as described during prior encounter. Skin:    General: Skin is warm and dry.     Findings: No erythema or rash.  Neurological:     General: No focal deficit present.     Mental Status: Alert and oriented to person, place, and time. Mental status is at baseline.     Motor: No weakness.  Psychiatric:        Mood and Affect: Mood normal.        Behavior: Behavior normal.    Assessment/Plan: The patient is scheduled tentatively for 10/12/2020 with Dr. Marla Roe.  She queried possibly moving it up to 09/30/2020 if available. Risks, benefits, and alternatives of procedure discussed, questions answered and consent obtained.    Smoking Status: Never Last Mammogram: 03/26/2020; Results: Known malignancy in upper outer quadrant, right breast.  BI-RADS 6.  Caprini Score: 6; Risk Factors include: age, history of cancer, gender, and length of planned surgery. Recommendation for mechanical prophylaxis. Encouraged early ambulation.   Pictures obtained: 09/01/2020.  Post-op Rx sent to pharmacy:   Patient was provided with the General Surgical Risk consent document and Pain Medication Agreement prior to their appointment.  They had adequate time to read through the risk consent  documents and Pain Medication Agreement. We also discussed them in person together during this preop appointment. All of their questions were answered to their satisfaction.  Recommended calling if they have any further questions.  Risk consent form and Pain Medication Agreement to be scanned into patient's chart.  The risks that can be encountered with and after placement of a breast implant placement were discussed and include the following but not limited to these: bleeding, infection, delayed healing, anesthesia risks, skin sensation changes, injury to structures including nerves, blood vessels, and muscles which may be temporary or permanent, allergies to tape, suture materials and glues, blood products, topical preparations or injected agents, skin contour irregularities, skin discoloration and swelling, deep vein thrombosis, cardiac and pulmonary complications, pain, which may persist, fluid accumulation, wrinkling of the skin over the implant, changes in nipple or breast sensation, implant leakage or rupture, faulty position of the implant, persistent pain, formation of tight scar tissue around the implant (capsular contracture), possible need for revisional surgery or staged procedures.     Electronically signed by: Krista Blue, PA-C 09/15/2020 11:47 AM

## 2020-09-17 ENCOUNTER — Other Ambulatory Visit: Payer: Self-pay | Admitting: Physician Assistant

## 2020-09-22 ENCOUNTER — Ambulatory Visit
Admission: RE | Admit: 2020-09-22 | Discharge: 2020-09-22 | Disposition: A | Discharge status: Home / Self Care | Payer: 59 | Source: Ambulatory Visit | Attending: Radiation Oncology | Admitting: Radiation Oncology

## 2020-09-22 ENCOUNTER — Other Ambulatory Visit: Payer: Self-pay

## 2020-09-22 ENCOUNTER — Encounter: Payer: Self-pay | Admitting: Radiation Oncology

## 2020-09-22 ENCOUNTER — Encounter (HOSPITAL_BASED_OUTPATIENT_CLINIC_OR_DEPARTMENT_OTHER): Payer: Self-pay | Admitting: Plastic Surgery

## 2020-09-22 VITALS — BP 141/76 | HR 84 | Wt 101.9 lb

## 2020-09-22 DIAGNOSIS — Z170 Estrogen receptor positive status [ER+]: Secondary | ICD-10-CM | POA: Diagnosis not present

## 2020-09-22 DIAGNOSIS — Z8614 Personal history of Methicillin resistant Staphylococcus aureus infection: Secondary | ICD-10-CM | POA: Diagnosis not present

## 2020-09-22 DIAGNOSIS — R011 Cardiac murmur, unspecified: Secondary | ICD-10-CM | POA: Diagnosis not present

## 2020-09-22 DIAGNOSIS — C50411 Malignant neoplasm of upper-outer quadrant of right female breast: Secondary | ICD-10-CM | POA: Diagnosis present

## 2020-09-22 DIAGNOSIS — E041 Nontoxic single thyroid nodule: Secondary | ICD-10-CM | POA: Diagnosis not present

## 2020-09-22 DIAGNOSIS — K219 Gastro-esophageal reflux disease without esophagitis: Secondary | ICD-10-CM | POA: Diagnosis not present

## 2020-09-22 DIAGNOSIS — Z79899 Other long term (current) drug therapy: Secondary | ICD-10-CM | POA: Diagnosis not present

## 2020-09-22 DIAGNOSIS — C773 Secondary and unspecified malignant neoplasm of axilla and upper limb lymph nodes: Secondary | ICD-10-CM | POA: Insufficient documentation

## 2020-09-22 DIAGNOSIS — Z9011 Acquired absence of right breast and nipple: Secondary | ICD-10-CM | POA: Diagnosis not present

## 2020-09-22 DIAGNOSIS — Z803 Family history of malignant neoplasm of breast: Secondary | ICD-10-CM | POA: Diagnosis not present

## 2020-09-22 DIAGNOSIS — M129 Arthropathy, unspecified: Secondary | ICD-10-CM | POA: Insufficient documentation

## 2020-09-22 NOTE — Consult Note (Signed)
NEW PATIENT EVALUATION  Name: Sarah Hurley  MRN: [id number]  Date:   09/22/2020     DOB: 28-May-1955   This 65 y.o. 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  REFERRING PHYSICIAN: Jerl Mina, MD  CHIEF COMPLAINT:  Chief Complaint  Patient presents with   Consult   Breast Cancer    DIAGNOSIS: The encounter diagnosis was Carcinoma of upper-outer quadrant of female breast, right (HCC).   PREVIOUS INVESTIGATIONS:  Mammogram and ultrasound reviewed Clinical notes reviewed Pathology report reviewed Case presented at weekly tumor conference  HPI: Patient is a 65 year old female out over 2 years having completed wide local excision for lobular carcinoma in situ in February 2020.  She had no adjuvant treatment at that time.  She recently presented with a self discovered mass in the right breast status post biopsy positive for invasive mammary carcinoma with lobular features.  Overall grade 2.  MRI of the breast showed no malignancy in the upper outer quadrant of the right breast demonstrated no enhancement on today's MRI no other evidence of malignancy by MRI criteria.  Patient underwent a right modified radical mastectomy for a 3.4 x 2.4 x 2.0 cm invasive lobular carcinoma.  Margins were clear but close at 2 mm.  Tumor was overall grade 2.  She had 3 lymph nodes which were involved with macro metastatic disease with extranodal extension.  She then underwent a more formal axillary lymph node dissection with 2 of 13 lymph nodes positive for metastatic carcinoma.  Patient is undergoing breast reconstruction has her final surgery scheduled for next week.  She had an Oncotype DX performed showing no benefit to chemotherapy.  She is now referred to ration collagen for consideration of  treatment.  She is doing well.  She specifically denies chest wall tenderness cough or bone pain.  PLANNED TREATMENT REGIMEN: Right breast and peripheral lymphatic radiation  PAST MEDICAL HISTORY:  has a past medical history of Arthritis, Breast mass, right (12/20/2017), Cancer (HCC), Complication of anesthesia, Fluttering heart, GERD (gastroesophageal reflux disease), Heart murmur, History of methicillin resistant staphylococcus aureus (MRSA), PONV (postoperative nausea and vomiting), Seasonal allergies, and Thyroid nodule.    PAST SURGICAL HISTORY:  Past Surgical History:  Procedure Laterality Date   AXILLARY LYMPH NODE DISSECTION Right 07/29/2020   Procedure: AXILLARY LYMPH NODE DISSECTION;  Surgeon: Earline Mayotte, MD;  Location: [facility] OR[facility];  Service: General;  Laterality: Right;  (no SLN)   BREAST BIOPSY Right 12/20/2017   stereo bx calcs/x clip/ PLEOMORPHIC LOBULAR CARCINOMA IN SITU   BREAST BIOPSY Right 04/09/2020   u/s bx path pending 10:00 6cmfn Q clip   BREAST CYST ASPIRATION Bilateral 2003   neg   BREAST LUMPECTOMY Right 03/26/2018   LCIS   BREAST LUMPECTOMY WITH NEEDLE LOCALIZATION Right 03/26/2018   Procedure: RIGHT BREAST WIDE EXCISION WITH NEEDLE LOCALIZATION;  Surgeon: Earline Mayotte, MD;  Location: [facility];  Service: General;  Laterality: Right;   BREAST RECONSTRUCTION WITH PLACEMENT OF TISSUE EXPANDER AND FLEX HD (ACELLULAR HYDRATED DERMIS) Right 06/17/2020   Procedure: IMMEDIATE RIGHT BREAST RECONSTRUCTION WITH PLACEMENT OF TISSUE EXPANDER AND FLEX HD (ACELLULAR HYDRATED DERMIS);  Surgeon: Peggye Form, DO;  Location: [facility];  Service: Plastics;  Laterality: Right;   COLONOSCOPY  2015   EYE SURGERY  BIL   SIMPLE MASTECTOMY WITH AXILLARY SENTINEL NODE BIOPSY Right 06/17/2020   Procedure: SIMPLE MASTECTOMY WITH AXILLARY SENTINEL NODE BIOPSY;  Surgeon: Robert Bellow, MD;  Location: [facility];  Service: General;  Laterality: Right;   TONSILLECTOMY       FAMILY HISTORY: family history includes Breast cancer in her sister; Heart disease in her father; Hypertension in her mother.  SOCIAL HISTORY:  reports that she has never smoked. She has never used smokeless tobacco. She reports current alcohol use of about 14.0 standard drinks per week. She reports that she does not use drugs.  ALLERGIES: Pseudoephedrine and Penicillins  MEDICATIONS:  Current Outpatient Medications  Medication Sig Dispense Refill   magnesium 30 MG tablet Take 30 mg by mouth 2 (two) times daily.     Olopatadine HCl (PATADAY OP) Place 1 drop into both eyes daily.     ondansetron (ZOFRAN ODT) 4 MG disintegrating tablet Take 1 tablet (4 mg total) by mouth every 8 (eight) hours as needed for nausea or vomiting. 20 tablet 0   OVER THE COUNTER MEDICATION Take 1 tablet by mouth daily. Chelated zinc with copper     PAPAYA ENZYME PO Take 1 tablet by mouth daily as needed (indigestion).     triamcinolone (NASACORT) 55 MCG/ACT AERO nasal inhaler Place 1 spray into the nose daily.     triamcinolone cream (KENALOG) 0.1 % Apply 1 application topically daily as needed (itching).  1   TYRVAYA 0.03 MG/ACT SOLN Place 1 puff into both nostrils 2 (two) times daily.     diazepam (VALIUM) 2 MG tablet Take 1 tablet (2 mg total) by mouth every 12 (twelve) hours as needed for muscle spasms. (Patient not taking: Reported on 09/22/2020) 20 tablet 0   No current facility-administered medications for this encounter.    ECOG PERFORMANCE STATUS:  0 - Asymptomatic  REVIEW OF SYSTEMS: Patient denies any weight loss, fatigue, weakness, fever, chills or night sweats. Patient denies any loss of vision, blurred vision. Patient denies any ringing  of the ears or hearing loss. No irregular heartbeat. Patient denies heart murmur or history of fainting. Patient denies any chest pain or pain radiating to her upper extremities. Patient denies any shortness of breath, difficulty breathing at night, cough or  hemoptysis. Patient denies any swelling in the lower legs. Patient denies any nausea vomiting, vomiting of blood, or coffee ground material in the vomitus. Patient denies any stomach pain. Patient states has had normal bowel movements no significant constipation or diarrhea. Patient denies any dysuria, hematuria or significant nocturia. Patient denies any problems walking, swelling in the joints or loss of balance. Patient denies any skin changes, loss of hair or loss of weight. Patient denies any excessive worrying or anxiety or significant depression. Patient denies any problems with insomnia. Patient denies excessive thirst, polyuria, polydipsia. Patient denies any swollen glands, patient denies easy bruising or easy bleeding. Patient denies any recent infections, allergies or URI. Patient "s visual fields have not changed significantly in recent time.   PHYSICAL EXAM: BP (!) 141/76   Pulse 84   Wt 101 lb 14.4 oz (46.2 kg)   BMI 18.05 kg/m  Stasis patient is status post breast expanders present in the right breast.  No dominant masses noted in either breast.  No axillary or supraclavicular adenopathy identified.  Well-developed well-nourished patient in NAD. HEENT reveals PERLA, EOMI, discs not visualized.  Oral cavity is clear. No oral mucosal lesions are identified. Neck is clear without evidence of  cervical or supraclavicular adenopathy. Lungs are clear to A&P. Cardiac examination is essentially unremarkable with regular rate and rhythm without murmur rub or thrill. Abdomen is benign with no organomegaly or masses noted. Motor sensory and DTR levels are equal and symmetric in the upper and lower extremities. Cranial nerves II through XII are grossly intact. Proprioception is intact. No peripheral adenopathy or edema is identified. No motor or sensory levels are noted. Crude visual fields are within normal range.  LABORATORY DATA: Pathology report reviewed    RADIOLOGY RESULTS: Mammograms  ultrasound and MRI scans reviewed compatible with above-stated findings   IMPRESSION: Stage IIb invasive lobular carcinoma the right breast status post right modified radical mastectomy with reconstruction in 65 year old female with poor prognostic factors including tumor size multiple lymph nodes involved with extranodal extension and close margins of 2 mm  PLAN: At this time I have recommended postmastectomy radiation.  Would plan on delivering 50.4 Pearline Cables to her right reconstructed breast and axillary lymph nodes based on the poor prognostic factors of tumor size multiple lymph node involvement with extranodal extension close margin of 2 mm.  Risks and benefits of treatment including skin reaction fatigue alteration blood counts possible inclusion superficial lung and slight chance of lymphedema in the right upper extremity.  I have set her up for simulation in the middle of September after her final breast reconstruction surgery next week.  Patient and husband both seem to comprehend my treatment plan well.  Patient also will be a candidate for antiestrogen therapy after completion of radiation.  I would like to take this opportunity to thank you for allowing me to participate in the care of your patient.Noreene Filbert, MD

## 2020-09-30 ENCOUNTER — Ambulatory Visit (HOSPITAL_BASED_OUTPATIENT_CLINIC_OR_DEPARTMENT_OTHER): Payer: 59 | Admitting: Certified Registered"

## 2020-09-30 ENCOUNTER — Other Ambulatory Visit: Payer: Self-pay

## 2020-09-30 ENCOUNTER — Encounter (HOSPITAL_BASED_OUTPATIENT_CLINIC_OR_DEPARTMENT_OTHER): Payer: Self-pay | Admitting: Plastic Surgery

## 2020-09-30 ENCOUNTER — Encounter (HOSPITAL_BASED_OUTPATIENT_CLINIC_OR_DEPARTMENT_OTHER)
Admission: RE | Disposition: A | Discharge status: Home / Self Care | Payer: Self-pay | Source: Home / Self Care | Attending: Plastic Surgery

## 2020-09-30 ENCOUNTER — Ambulatory Visit (HOSPITAL_BASED_OUTPATIENT_CLINIC_OR_DEPARTMENT_OTHER)
Admission: RE | Admit: 2020-09-30 | Discharge: 2020-09-30 | Disposition: A | Discharge status: Home / Self Care | Payer: 59 | Attending: Plastic Surgery | Admitting: Plastic Surgery

## 2020-09-30 DIAGNOSIS — Z9011 Acquired absence of right breast and nipple: Secondary | ICD-10-CM | POA: Insufficient documentation

## 2020-09-30 DIAGNOSIS — Z880 Allergy status to penicillin: Secondary | ICD-10-CM | POA: Insufficient documentation

## 2020-09-30 DIAGNOSIS — Z888 Allergy status to other drugs, medicaments and biological substances status: Secondary | ICD-10-CM | POA: Diagnosis not present

## 2020-09-30 DIAGNOSIS — N6489 Other specified disorders of breast: Secondary | ICD-10-CM | POA: Insufficient documentation

## 2020-09-30 DIAGNOSIS — C50411 Malignant neoplasm of upper-outer quadrant of right female breast: Secondary | ICD-10-CM | POA: Diagnosis not present

## 2020-09-30 DIAGNOSIS — Z803 Family history of malignant neoplasm of breast: Secondary | ICD-10-CM | POA: Diagnosis not present

## 2020-09-30 DIAGNOSIS — Z8614 Personal history of Methicillin resistant Staphylococcus aureus infection: Secondary | ICD-10-CM | POA: Diagnosis not present

## 2020-09-30 HISTORY — PX: REMOVAL OF TISSUE EXPANDER AND PLACEMENT OF IMPLANT: SHX6457

## 2020-09-30 HISTORY — PX: MASTOPEXY: SHX5358

## 2020-09-30 SURGERY — REMOVAL, TISSUE EXPANDER, BREAST, WITH IMPLANT INSERTION
Anesthesia: General | Site: Breast | Laterality: Right

## 2020-09-30 MED ORDER — OXYCODONE HCL 5 MG PO TABS: 5.0000 mg | ORAL_TABLET | ORAL | Status: DC | PRN

## 2020-09-30 MED ORDER — OXYCODONE HCL 5 MG PO TABS: 5.0000 mg | ORAL_TABLET | Freq: Once | ORAL | Status: DC | PRN

## 2020-09-30 MED ORDER — CIPROFLOXACIN IN D5W 400 MG/200ML IV SOLN
INTRAVENOUS | Status: AC
  Filled 2020-09-30: qty 200

## 2020-09-30 MED ORDER — OXYCODONE HCL 5 MG/5ML PO SOLN
5.0000 mg | Freq: Once | ORAL | Status: DC | PRN
Start: 2020-09-30 — End: 2020-09-30

## 2020-09-30 MED ORDER — ROCURONIUM BROMIDE 10 MG/ML (PF) SYRINGE
PREFILLED_SYRINGE | INTRAVENOUS | Status: AC
  Filled 2020-09-30: qty 50

## 2020-09-30 MED ORDER — ONDANSETRON HCL 4 MG/2ML IJ SOLN
INTRAMUSCULAR | Status: DC | PRN
  Administered 2020-09-30: 4 mg via INTRAVENOUS

## 2020-09-30 MED ORDER — BSS IO SOLN
INTRAOCULAR | Status: AC
  Filled 2020-09-30: qty 30

## 2020-09-30 MED ORDER — ACETAMINOPHEN 325 MG PO TABS: 325.0000 mg | ORAL_TABLET | ORAL | Status: DC | PRN

## 2020-09-30 MED ORDER — MEPERIDINE HCL 25 MG/ML IJ SOLN: 6.2500 mg | INTRAMUSCULAR | Status: DC | PRN

## 2020-09-30 MED ORDER — FENTANYL CITRATE (PF) 100 MCG/2ML IJ SOLN
INTRAMUSCULAR | Status: AC
  Filled 2020-09-30: qty 2

## 2020-09-30 MED ORDER — KETOROLAC TROMETHAMINE 15 MG/ML IJ SOLN
INTRAMUSCULAR | Status: AC
  Filled 2020-09-30: qty 1

## 2020-09-30 MED ORDER — KETOROLAC TROMETHAMINE 15 MG/ML IJ SOLN
15.0000 mg | Freq: Once | INTRAMUSCULAR | Status: AC
  Administered 2020-09-30: 15 mg via INTRAVENOUS

## 2020-09-30 MED ORDER — PROPOFOL 500 MG/50ML IV EMUL
INTRAVENOUS | Status: DC | PRN
  Administered 2020-09-30: 25 ug/kg/min via INTRAVENOUS

## 2020-09-30 MED ORDER — SODIUM CHLORIDE 0.9 % IV SOLN
INTRAVENOUS | Status: DC | PRN
  Administered 2020-09-30: 500 mL

## 2020-09-30 MED ORDER — LIDOCAINE-EPINEPHRINE 1 %-1:100000 IJ SOLN
INTRAMUSCULAR | Status: DC | PRN
  Administered 2020-09-30: 20 mL

## 2020-09-30 MED ORDER — ACETAMINOPHEN 160 MG/5ML PO SOLN: 325.0000 mg | ORAL | Status: DC | PRN

## 2020-09-30 MED ORDER — FENTANYL CITRATE (PF) 100 MCG/2ML IJ SOLN: 25.0000 ug | INTRAMUSCULAR | Status: DC | PRN

## 2020-09-30 MED ORDER — BSS IO SOLN
15.0000 mL | Freq: Once | INTRAOCULAR | Status: AC
  Administered 2020-09-30: 15 mL

## 2020-09-30 MED ORDER — LACTATED RINGERS IV SOLN: INTRAVENOUS | Status: DC

## 2020-09-30 MED ORDER — DEXMEDETOMIDINE (PRECEDEX) IN NS 20 MCG/5ML (4 MCG/ML) IV SYRINGE
PREFILLED_SYRINGE | INTRAVENOUS | Status: AC
  Filled 2020-09-30: qty 5

## 2020-09-30 MED ORDER — BUPIVACAINE HCL (PF) 0.25 % IJ SOLN
INTRAMUSCULAR | Status: AC
  Filled 2020-09-30: qty 60

## 2020-09-30 MED ORDER — EPHEDRINE 5 MG/ML INJ
INTRAVENOUS | Status: AC
  Filled 2020-09-30: qty 60

## 2020-09-30 MED ORDER — SODIUM CHLORIDE 0.9% FLUSH: 3.0000 mL | Freq: Two times a day (BID) | INTRAVENOUS | Status: DC

## 2020-09-30 MED ORDER — SODIUM CHLORIDE 0.9% FLUSH: 3.0000 mL | INTRAVENOUS | Status: DC | PRN

## 2020-09-30 MED ORDER — PHENYLEPHRINE 40 MCG/ML (10ML) SYRINGE FOR IV PUSH (FOR BLOOD PRESSURE SUPPORT)
PREFILLED_SYRINGE | INTRAVENOUS | Status: AC
  Filled 2020-09-30: qty 40

## 2020-09-30 MED ORDER — KETOROLAC TROMETHAMINE 0.5 % OP SOLN
OPHTHALMIC | Status: AC
  Filled 2020-09-30: qty 5

## 2020-09-30 MED ORDER — CHLORHEXIDINE GLUCONATE CLOTH 2 % EX PADS: 6.0000 | MEDICATED_PAD | Freq: Once | CUTANEOUS | Status: DC

## 2020-09-30 MED ORDER — DEXMEDETOMIDINE (PRECEDEX) IN NS 20 MCG/5ML (4 MCG/ML) IV SYRINGE
PREFILLED_SYRINGE | INTRAVENOUS | Status: DC | PRN
  Administered 2020-09-30: 8 ug via INTRAVENOUS

## 2020-09-30 MED ORDER — SODIUM CHLORIDE 0.9 % IV SOLN: 250.0000 mL | INTRAVENOUS | Status: DC | PRN

## 2020-09-30 MED ORDER — FENTANYL CITRATE (PF) 100 MCG/2ML IJ SOLN
INTRAMUSCULAR | Status: DC | PRN
  Administered 2020-09-30: 50 ug via INTRAVENOUS
  Administered 2020-09-30: 25 ug via INTRAVENOUS
  Administered 2020-09-30: 50 ug via INTRAVENOUS

## 2020-09-30 MED ORDER — DEXAMETHASONE SODIUM PHOSPHATE 4 MG/ML IJ SOLN
INTRAMUSCULAR | Status: DC | PRN
  Administered 2020-09-30: 4 mg via INTRAVENOUS

## 2020-09-30 MED ORDER — ONDANSETRON HCL 4 MG/2ML IJ SOLN: 4.0000 mg | Freq: Once | INTRAMUSCULAR | Status: DC | PRN

## 2020-09-30 MED ORDER — PROPOFOL 10 MG/ML IV BOLUS
INTRAVENOUS | Status: DC | PRN
  Administered 2020-09-30: 150 mg via INTRAVENOUS

## 2020-09-30 MED ORDER — KETOROLAC TROMETHAMINE 0.5 % OP SOLN
1.0000 [drp] | Freq: Three times a day (TID) | OPHTHALMIC | Status: DC | PRN
  Administered 2020-09-30: 1 [drp] via OPHTHALMIC

## 2020-09-30 MED ORDER — LIDOCAINE-EPINEPHRINE 1 %-1:100000 IJ SOLN
INTRAMUSCULAR | Status: AC
  Filled 2020-09-30: qty 2

## 2020-09-30 MED ORDER — MIDAZOLAM HCL 2 MG/2ML IJ SOLN
INTRAMUSCULAR | Status: AC
  Filled 2020-09-30: qty 2

## 2020-09-30 MED ORDER — SODIUM CHLORIDE 0.9 % IV SOLN
INTRAVENOUS | Status: AC
  Filled 2020-09-30 (×2): qty 10

## 2020-09-30 MED ORDER — CIPROFLOXACIN IN D5W 400 MG/200ML IV SOLN: 400.0000 mg | INTRAVENOUS | Status: DC

## 2020-09-30 MED ORDER — MIDAZOLAM HCL 5 MG/5ML IJ SOLN
INTRAMUSCULAR | Status: DC | PRN
  Administered 2020-09-30: 2 mg via INTRAVENOUS

## 2020-09-30 MED ORDER — LIDOCAINE HCL (CARDIAC) PF 100 MG/5ML IV SOSY
PREFILLED_SYRINGE | INTRAVENOUS | Status: DC | PRN
  Administered 2020-09-30: 60 mg via INTRAVENOUS

## 2020-09-30 SURGICAL SUPPLY — 83 items
ADH SKN CLS APL DERMABOND .7 (GAUZE/BANDAGES/DRESSINGS) ×4
BAG DECANTER FOR FLEXI CONT (MISCELLANEOUS) ×3 IMPLANT
BINDER BREAST LRG (GAUZE/BANDAGES/DRESSINGS) IMPLANT
BINDER BREAST MEDIUM (GAUZE/BANDAGES/DRESSINGS) ×3 IMPLANT
BINDER BREAST XLRG (GAUZE/BANDAGES/DRESSINGS) IMPLANT
BINDER BREAST XXLRG (GAUZE/BANDAGES/DRESSINGS) IMPLANT
BIOPATCH RED 1 DISK 7.0 (GAUZE/BANDAGES/DRESSINGS) IMPLANT
BLADE HEX COATED 2.75 (ELECTRODE) IMPLANT
BLADE SURG 10 STRL SS (BLADE) ×6 IMPLANT
BLADE SURG 15 STRL LF DISP TIS (BLADE) ×2 IMPLANT
BLADE SURG 15 STRL SS (BLADE) ×3
BNDG GAUZE ELAST 4 BULKY (GAUZE/BANDAGES/DRESSINGS) IMPLANT
CANISTER SUCT 1200ML W/VALVE (MISCELLANEOUS) ×3 IMPLANT
COVER BACK TABLE 60X90IN (DRAPES) ×3 IMPLANT
COVER MAYO STAND STRL (DRAPES) ×3 IMPLANT
DECANTER SPIKE VIAL GLASS SM (MISCELLANEOUS) IMPLANT
DERMABOND ADVANCED (GAUZE/BANDAGES/DRESSINGS) ×2
DERMABOND ADVANCED .7 DNX12 (GAUZE/BANDAGES/DRESSINGS) ×4 IMPLANT
DRAIN CHANNEL 19F RND (DRAIN) IMPLANT
DRAPE LAPAROSCOPIC ABDOMINAL (DRAPES) ×3 IMPLANT
DRSG OPSITE POSTOP 4X6 (GAUZE/BANDAGES/DRESSINGS) ×6 IMPLANT
DRSG PAD ABDOMINAL 8X10 ST (GAUZE/BANDAGES/DRESSINGS) ×6 IMPLANT
DRSG TEGADERM 2-3/8X2-3/4 SM (GAUZE/BANDAGES/DRESSINGS) IMPLANT
ELECT BLADE 4.0 EZ CLEAN MEGAD (MISCELLANEOUS)
ELECT BLADE 6.5 EXT (BLADE) IMPLANT
ELECT COATED BLADE 2.86 ST (ELECTRODE) IMPLANT
ELECT REM PT RETURN 9FT ADLT (ELECTROSURGICAL) ×3
ELECTRODE BLDE 4.0 EZ CLN MEGD (MISCELLANEOUS) IMPLANT
ELECTRODE REM PT RTRN 9FT ADLT (ELECTROSURGICAL) ×2 IMPLANT
EVACUATOR SILICONE 100CC (DRAIN) IMPLANT
FUNNEL KELLER 2 DISP (MISCELLANEOUS) ×3 IMPLANT
GAUZE SPONGE 4X4 12PLY STRL (GAUZE/BANDAGES/DRESSINGS) IMPLANT
GAUZE SPONGE 4X4 12PLY STRL LF (GAUZE/BANDAGES/DRESSINGS) IMPLANT
GLOVE SURG ENC MOIS LTX SZ6 (GLOVE) ×12 IMPLANT
GLOVE SURG POLYISO LF SZ8 (GLOVE) ×3 IMPLANT
GLOVE SURG UNDER POLY LF SZ7 (GLOVE) ×3 IMPLANT
GOWN STRL REUS W/ TWL LRG LVL3 (GOWN DISPOSABLE) ×4 IMPLANT
GOWN STRL REUS W/TWL 2XL LVL3 (GOWN DISPOSABLE) ×3 IMPLANT
GOWN STRL REUS W/TWL LRG LVL3 (GOWN DISPOSABLE) ×6
IMPL GEL 300CC (Breast) ×2 IMPLANT
IMPLANT GEL 300CC (Breast) ×3 IMPLANT
IV NS 1000ML (IV SOLUTION)
IV NS 1000ML BAXH (IV SOLUTION) IMPLANT
IV NS 500ML (IV SOLUTION)
IV NS 500ML BAXH (IV SOLUTION) IMPLANT
KIT FILL SYSTEM UNIVERSAL (SET/KITS/TRAYS/PACK) IMPLANT
NDL SAFETY ECLIPSE 18X1.5 (NEEDLE) ×2 IMPLANT
NEEDLE HYPO 18GX1.5 SHARP (NEEDLE) ×3
NEEDLE HYPO 25X1 1.5 SAFETY (NEEDLE) ×3 IMPLANT
NEEDLE SPNL 18GX3.5 QUINCKE PK (NEEDLE) IMPLANT
NS IRRIG 1000ML POUR BTL (IV SOLUTION) IMPLANT
PACK BASIN DAY SURGERY FS (CUSTOM PROCEDURE TRAY) ×3 IMPLANT
PENCIL SMOKE EVACUATOR (MISCELLANEOUS) ×3 IMPLANT
PIN SAFETY STERILE (MISCELLANEOUS) IMPLANT
SIZER BREAST GEL REUSE 300CC (SIZER) ×3
SIZER BREAST REUSE 285CC (SIZER) ×3
SIZER BRST GEL REUSE 300CC (SIZER) ×2 IMPLANT
SIZER BRST REUSE 285CC (SIZER) ×2 IMPLANT
SLEEVE SCD COMPRESS KNEE MED (STOCKING) ×3 IMPLANT
SPONGE T-LAP 18X18 ~~LOC~~+RFID (SPONGE) ×12 IMPLANT
STRIP CLOSURE SKIN 1/2X4 (GAUZE/BANDAGES/DRESSINGS) IMPLANT
STRIP SUTURE WOUND CLOSURE 1/2 (MISCELLANEOUS) ×9 IMPLANT
SUT MNCRL AB 3-0 PS2 18 (SUTURE) IMPLANT
SUT MNCRL AB 4-0 PS2 18 (SUTURE) ×15 IMPLANT
SUT MON AB 3-0 SH 27 (SUTURE) ×6
SUT MON AB 3-0 SH27 (SUTURE) ×4 IMPLANT
SUT MON AB 5-0 PS2 18 (SUTURE) ×3 IMPLANT
SUT PDS 3-0 CT2 (SUTURE) ×6
SUT PDS AB 2-0 CT2 27 (SUTURE) IMPLANT
SUT PDS II 3-0 CT2 27 ABS (SUTURE) ×4 IMPLANT
SUT SILK 3 0 PS 1 (SUTURE) IMPLANT
SUT VIC AB 3-0 SH 27 (SUTURE)
SUT VIC AB 3-0 SH 27X BRD (SUTURE) IMPLANT
SUT VICRYL 4-0 PS2 18IN ABS (SUTURE) ×3 IMPLANT
SYR 50ML LL SCALE MARK (SYRINGE) IMPLANT
SYR BULB IRRIG 60ML STRL (SYRINGE) ×3 IMPLANT
SYR CONTROL 10ML LL (SYRINGE) ×3 IMPLANT
TAPE MEASURE VINYL STERILE (MISCELLANEOUS) IMPLANT
TOWEL GREEN STERILE FF (TOWEL DISPOSABLE) ×6 IMPLANT
TRAY DSU PREP LF (CUSTOM PROCEDURE TRAY) ×3 IMPLANT
TUBE CONNECTING 20X1/4 (TUBING) ×3 IMPLANT
UNDERPAD 30X36 HEAVY ABSORB (UNDERPADS AND DIAPERS) ×6 IMPLANT
YANKAUER SUCT BULB TIP NO VENT (SUCTIONS) ×3 IMPLANT

## 2020-09-30 NOTE — Anesthesia Postprocedure Evaluation (Signed)
Anesthesia Post Note  Patient: Sarah Hurley  Procedure(s) Performed: REMOVAL OF TISSUE EXPANDER AND PLACEMENT OF IMPLANT RIGHT BREAST (Right: Breast) LEFT BREAST MASTOPEXY (Left: Breast)     Patient location during evaluation: Phase II Anesthesia Type: General Level of consciousness: awake Pain management: pain level controlled Vital Signs Assessment: post-procedure vital signs reviewed and stable Respiratory status: spontaneous breathing Cardiovascular status: stable Postop Assessment: no apparent nausea or vomiting Anesthetic complications: no   No notable events documented.  Last Vitals:  Vitals:   09/30/20 1316 09/30/20 1330  BP: (!) 154/86 (!) 156/81  Pulse: 96 96  Resp: 20 17  Temp:    SpO2: 96% 95%    Last Pain:  Vitals:   09/30/20 1316  TempSrc:   PainSc: 0-No pain                 Huston Foley

## 2020-09-30 NOTE — Anesthesia Procedure Notes (Signed)
Procedure Name: LMA Insertion Date/Time: 09/30/2020 11:02 AM Performed by: Signe Colt, CRNA Pre-anesthesia Checklist: Patient identified, Emergency Drugs available, Suction available and Patient being monitored Patient Re-evaluated:Patient Re-evaluated prior to induction Oxygen Delivery Method: Circle System Utilized Preoxygenation: Pre-oxygenation with 100% oxygen Induction Type: IV induction Ventilation: Mask ventilation without difficulty LMA: LMA inserted LMA Size: 4.0 Number of attempts: 1 Airway Equipment and Method: bite block Placement Confirmation: positive ETCO2 Tube secured with: Tape Dental Injury: Teeth and Oropharynx as per pre-operative assessment

## 2020-09-30 NOTE — Discharge Instructions (Addendum)
INSTRUCTIONS FOR AFTER SURGERY   You will likely have some questions about what to expect following your operation.  The following information will help you and your family understand what to expect when you are discharged from the hospital.  Following these guidelines will help ensure a smooth recovery and reduce risks of complications.  Postoperative instructions include information on: diet, wound care, medications and physical activity.  AFTER SURGERY Expect to go home after the procedure.  In some cases, you may need to spend one night in the hospital for observation.  DIET This surgery does not require a specific diet.  However, I have to mention that the healthier you eat the better your body can start healing. It is important to increasing your protein intake.  This means limiting the foods with added sugar.  Focus on fruits and vegetables and some meat. It is very important to drink water after your surgery.  If your urine is bright yellow, then it is concentrated, and you need to drink more water.  As a general rule after surgery, you should have 8 ounces of water every hour while awake.  If you find you are persistently nauseated or unable to take in liquids let us know.  NO TOBACCO USE or EXPOSURE.  This will slow your healing process and increase the risk of a wound.  WOUND CARE If you don't have a drain: You can shower the day after surgery.  Use fragrance free soap.  Dial, [place], [place] and Cetaphil are usually mild on the skin.  If you have steri-strips / tape directly attached to your skin leave them in place. It is OK to get these wet.  No baths, pools or hot tubs for two weeks. We close your incision to leave the smallest and best-looking scar. No ointment or creams on your incisions until given the go ahead.  Especially not Neosporin (Too many skin reactions with this one).  A few weeks after surgery you can use Mederma and start massaging the scar. We ask you to wear your binder or  sports bra for the first 6 weeks around the clock, including while sleeping. This provides added comfort and helps reduce the fluid accumulation at the surgery site.  ACTIVITY No heavy lifting until cleared by the doctor.  It is OK to walk and climb stairs. In fact, moving your legs is very important to decrease your risk of a blood clot.  It will also help keep you from getting deconditioned.  Every 1 to 2 hours get up and walk for 5 minutes. This will help with a quicker recovery back to normal.  Let pain be your guide so you don't do too much.  NO, you cannot do the spring cleaning and don't plan on taking care of anyone else.  This is your time for TLC.   WORK Everyone returns to work at different times. As a rough guide, most people take at least 1 - 2 weeks off prior to returning to work. If you need documentation for your job, bring the forms to your postoperative follow up visit.  DRIVING Arrange for someone to bring you home from the hospital.  You may be able to drive a few days after surgery but not while taking any narcotics or valium.  BOWEL MOVEMENTS Constipation can occur after anesthesia and while taking pain medication.  It is important to stay ahead for your comfort.  We recommend taking Milk of Magnesia (2 tablespoons; twice a day) while taking  the pain pills.  SEROMA This is fluid your body tried to put in the surgical site.  This is normal but if it creates excessive pain and swelling let us know.  It usually decreases in a few weeks.  MEDICATIONS and PAIN CONTROL At your preoperative visit for you history and physical you were given the following medications: An antibiotic: Start this medication when you get home and take according to the instructions on the bottle. Zofran 4 mg:  This is to treat nausea and vomiting.  You can take this every 6 hours as needed and only if needed. Norco (hydrocodone/acetaminophen) 5/325 mg:  This is only to be used after you have taken the  motrin or the tylenol. Every 8 hours as needed. Over the counter Medication to take: Ibuprofen (Motrin) 600 mg:  Take this every 6 hours.  If you have additional pain then take 500 mg of the tylenol.  Only take the Norco after you have tried these two. Miralax or stool softener of choice: Take this according to the bottle if you take the [place] Call your surgeon's office if any of the following occur:  Fever 101 degrees F or greater  Excessive bleeding or fluid from the incision site.  Pain that increases over time without aid from the medications  Redness, warmth, or pus draining from incision sites  Persistent nausea or inability to take in liquids  Severe misshapen area that underwent the operation.  Post Anesthesia Home Care Instructions  Activity: Get plenty of rest for the remainder of the day. A responsible individual must stay with you for 24 hours following the procedure.  For the next 24 hours, DO NOT: -Drive a car -Paediatric nurse -Drink alcoholic beverages -Take any medication unless instructed by your physician -Make any legal decisions or sign important papers.  Meals: Start with liquid foods such as gelatin or soup. Progress to regular foods as tolerated. Avoid greasy, spicy, heavy foods. If nausea and/or vomiting occur, drink only clear liquids until the nausea and/or vomiting subsides. Call your physician if vomiting continues.  Special Instructions/Symptoms: Your throat may feel dry or sore from the anesthesia or the breathing tube placed in your throat during surgery. If this causes discomfort, gargle with warm salt water. The discomfort should disappear within 24 hours.  If you had a scopolamine patch placed behind your ear for the management of post- operative nausea and/or vomiting:  1. The medication in the patch is effective for 72 hours, after which it should be removed.  Wrap patch in a tissue and discard in the trash. Wash hands thoroughly  with soap and water. 2. You may remove the patch earlier than 72 hours if you experience unpleasant side effects which may include dry mouth, dizziness or visual disturbances. 3. Avoid touching the patch. Wash your hands with soap and water after contact with the patch.     Corneal Abrasion    The cornea is the clear covering at the front and center of the eye. When looking at the colored portion of the eye (iris), you are looking through the cornea. This very thin tissue is made up of many layers. The surface layer is a single layer of cells (corneal epithelium) and is one of the most sensitive tissues in the body. If a scratch or injury causes the corneal epithelium to come off, it is called a corneal abrasion. If the injury extends to the tissues below the epithelium, the condition is called  a corneal ulcer.  CAUSES  Scratches.  Trauma.  Foreign body in the eye. Some people have recurrences of abrasions in the area of the original injury even after it has healed (recurrent erosion syndrome). Recurrent erosion syndrome generally improves and goes away with time.  SYMPTOMS  Eye pain.  Difficulty or inability to keep the injured eye open.  The eye becomes very sensitive to light.  Recurrent erosions tend to happen suddenly, first thing in the morning, usually after waking up and opening the eye. DIAGNOSIS  Your health care provider can diagnose a corneal abrasion during an eye exam. Dye is usually placed in the eye using a drop or a small paper strip moistened by your tears. When the eye is examined with a special light, the abrasion shows up clearly because of the dye.  TREATMENT  Small abrasions may be treated with antibiotic drops or ointment alone.  A pressure patch may be put over the eye. If this is done, follow your doctor's instructions for when to remove the patch. Do not drive or use machines while the eye patch is on. Judging distances is hard to do with a patch on. If the abrasion  becomes infected and spreads to the deeper tissues of the cornea, a corneal ulcer can result. This is serious because it can cause corneal scarring. Corneal scars interfere with light passing through the cornea and cause a loss of vision in the involved eye.  HOME CARE INSTRUCTIONS  Use medicine or ointment as directed. Only take over-the-counter or prescription medicines for pain, discomfort, or fever as directed by your health care provider.  Do not drive or operate machinery if your eye is patched. Your ability to judge distances is impaired.  If your health care provider has given you a follow-up appointment, it is very important to keep that appointment. Not keeping the appointment could result in a severe eye infection or permanent loss of vision. If there is any problem keeping the appointment, let your health care provider know. SEEK MEDICAL CARE IF:  You have pain, light sensitivity, and a scratchy feeling in one eye or both eyes.  Your pressure patch keeps loosening up, and you can blink your eye under the patch after treatment.  Any kind of discharge develops from the eye after treatment or if the lids stick together in the morning.  You have the same symptoms in the morning as you did with the original abrasion days, weeks, or months after the abrasion healed.  Use eye drops provided:  1 drop affected eye(s) every 8 hours for 24 hours.  Call anesthesia department @ [phone] if not significantly improved in 24 hours.

## 2020-09-30 NOTE — Transfer of Care (Signed)
Immediate Anesthesia Transfer of Care Note  Patient: Sarah Hurley  Procedure(s) Performed: REMOVAL OF TISSUE EXPANDER AND PLACEMENT OF IMPLANT RIGHT BREAST (Right: Breast) LEFT BREAST MASTOPEXY (Left: Breast)  Patient Location: PACU  Anesthesia Type:General  Level of Consciousness: drowsy and patient cooperative  Airway & Oxygen Therapy: Patient Spontanous Breathing and Patient connected to face mask oxygen  Post-op Assessment: Report given to RN and Post -op Vital signs reviewed and stable  Post vital signs: Reviewed and stable  Last Vitals:  Vitals Value Taken Time  BP    Temp    Pulse 87 09/30/20 1245  Resp 11 09/30/20 1245  SpO2 98 % 09/30/20 1245  Vitals shown include unvalidated device data.  Last Pain:  Vitals:   09/30/20 0919  TempSrc: Oral  PainSc: 0-No pain         Complications: No notable events documented.

## 2020-09-30 NOTE — Op Note (Signed)
Op report Bilateral Exchange   DATE OF OPERATION: 09/30/2020  LOCATION: [place]  SURGICAL DIVISION: Plastic Surgery  PREOPERATIVE DIAGNOSIS:  1.History of right breast cancer.  2. Acquired absence of right breast.  3. Left breast asymmetry.  POSTOPERATIVE DIAGNOSIS:  1.  History of right breast cancer.  2. Acquired absence of right breast.  3. Left breast asymmetry.  PROCEDURE:  1. Right exchange of tissue expander for implant.  2. Right capsulotomies for implant respositioning. 3. Left mastopexy for symmetry.  SURGEON: Deionte Spivack [place]anger Liara Holm, DO  ASSISTANT: Krista Blue, PA  ANESTHESIA:  General.   COMPLICATIONS: None.   IMPLANTS: Right - [place] Smooth Round High Profile Gel 300cc. Ref [id number].  Serial Number [id number]  INDICATIONS FOR PROCEDURE:  The patient, Sarah Hurley, is a 65 y.o. female born on 31-Jan-1956, is here for treatment after a right mastectomy.  She had a tissue expander placed at the time of the mastectomy. She now presents for exchange of her expander for an implant.  She requires capsulotomies to better position the implant.  She is also interested in a left side mastopexy for improved symmetry.  MRN: [id number]  CONSENT:  Informed consent was obtained directly from the patient. Risks, benefits and alternatives were fully discussed. Specific risks including but not limited to bleeding, infection, hematoma, seroma, scarring, pain, implant infection, implant extrusion, capsular contracture, asymmetry, wound healing problems, and need for further surgery were all discussed. The patient did have an ample opportunity to have her questions answered to her satisfaction.   DESCRIPTION OF PROCEDURE:  The patient was taken to the operating room. SCDs were placed and IV antibiotics were given. The patient's chest was prepped and draped in a sterile fashion. A time out was performed and the implants to be used were identified.     Right breast: Local with epinephrine was used to infiltrate at the incision site which was marked to be the inframammary fold. The old mastectomy scar was lateral and very thin at the skin.  The skin was incised with the #10 blade.  The bovie was used to dissect to the capsule and ADM. The mastectomy flap was raised over the Flex HD. The ADM was split to expose and remove the tissue expander.  Inspection of the pocket showed a normal healthy capsule and good integration of the biologic matrix.  The pocket was irrigated with antibiotic solution.  Circumferential capsulotomies were performed to allow for breast pocket expansion.  Measurements were made and a sizer used to confirm adequate pocket size for the implant dimensions.  Hemostasis was ensured with electrocautery. New gloves were placed. The implant was soaked in antibiotic solution and then placed in the pocket and oriented appropriately. The capsule was closed with a 3-0 PDS suture. The remaining skin was closed with 3-0 Monocryl deep dermal and 4-0 Monocryl subcuticular stitches.  Dermabond was applied to the incision site.   Left side: Preoperative markings were confirmed.  Incision lines were injected with local with epinephrine.  After waiting for vasoconstriction, the marked lines were incised.  A Wise-pattern mastopexy was performed.  The vertical pedicle and periareola were de-epithelialized.  Hemostasis was achieved.  The nipple was sutured into position and the soft tissue was closed with 4-0 Monocryl.  The deep tissues were approximated with 3-0 Monocryl sutures and the skin was closed with deep dermal and subcuticular 4-0 Monocryl sutures.  Dermabond was applied.  A breast binder and ABDs were placed.  The nipple and skin  flaps had good capillary refill at the end of the procedure.  The patient tolerated the procedure well. The patient was allowed to wake from anesthesia and taken to the recovery room in satisfactory condition.  The  advanced practice practitioner (APP) assisted throughout the case.  The APP was essential in retraction and counter traction when needed to make the case progress smoothly.  This retraction and assistance made it possible to see the tissue plans for the procedure.  The assistance was needed for blood control, tissue re-approximation and assisted with closure of the incision site.

## 2020-09-30 NOTE — Interval H&P Note (Signed)
History and Physical Interval Note:  09/30/2020 9:17 AM  Sarah Hurley  has presented today for surgery, with the diagnosis of Acquired absence of right breast, Carcinoma of upper-outer quadrant of right breast.  The various methods of treatment have been discussed with the patient and family. After consideration of risks, benefits and other options for treatment, the patient has consented to  Procedure(s) with comments: REMOVAL OF TISSUE EXPANDER AND PLACEMENT OF IMPLANT (Right) - 2 hours PLACEMENT OF BREAST IMPLANT (Left) MASTOPEXY (Left) as a surgical intervention.  The patient's history has been reviewed, patient examined, no change in status, stable for surgery.  I have reviewed the patient's chart and labs.  Questions were answered to the patient's satisfaction.     Loel Lofty Rether Rison

## 2020-09-30 NOTE — Anesthesia Preprocedure Evaluation (Signed)
Anesthesia Evaluation  Patient identified by MRN, date of birth, ID band Patient awake    Reviewed: Allergy & Precautions, NPO status , Patient's Chart, lab work & pertinent test results  History of Anesthesia Complications (+) PONV and history of anesthetic complications  Airway Mallampati: I  TM Distance: >3 FB Neck ROM: Full    Dental no notable dental hx.    Pulmonary neg pulmonary ROS, neg sleep apnea, neg COPD,    Pulmonary exam normal - rhonchi (-) wheezing      Cardiovascular Exercise Tolerance: Good (-) hypertension(-) CAD, (-) Past MI, (-) Cardiac Stents and (-) CABG negative cardio ROS Normal cardiovascular exam - Systolic murmurs and - Diastolic murmurs    Neuro/Psych neg Seizures negative neurological ROS  negative psych ROS   GI/Hepatic GERD  ,  Endo/Other  neg diabetes  Renal/GU      Musculoskeletal   Abdominal Normal abdominal exam  (+) - obese,   Peds  Hematology   Anesthesia Other Findings Past Medical History: No date: Arthritis 12/20/2017: Breast mass, right     Comment:  PLEOMORPHIC LOBULAR CARCINOMA IN SITU No date: Cancer ([place])     Comment:  no hx of cancer per pt No date: Complication of anesthesia     Comment:  PT STATES THAT ANESTHESIA HAS ALWAYS USE THE ANESTHESIA               THAT IS GIVEN TO HEART PATIENTS No date: Fluttering heart No date: GERD (gastroesophageal reflux disease)     Comment:  OCC No date: Heart murmur No date: History of methicillin resistant staphylococcus aureus (MRSA) No date: PONV (postoperative nausea and vomiting)     Comment:  patient denies PONV 06/08/20 No date: Seasonal allergies No date: Thyroid nodule   Reproductive/Obstetrics                             Anesthesia Physical  Anesthesia Plan  ASA: 2  Anesthesia Plan: General   Post-op Pain Management:    Induction: Intravenous  PONV Risk Score and Plan: 3 and  Ondansetron, Dexamethasone and Midazolam  Airway Management Planned: LMA  Additional Equipment: None  Intra-op Plan:   Post-operative Plan: Extubation in OR  Informed Consent: I have reviewed the patients History and Physical, chart, labs and discussed the procedure including the risks, benefits and alternatives for the proposed anesthesia with the patient or authorized representative who has indicated his/her understanding and acceptance.     Dental advisory given  Plan Discussed with: CRNA  Anesthesia Plan Comments:         Anesthesia Quick Evaluation

## 2020-10-01 ENCOUNTER — Encounter (HOSPITAL_BASED_OUTPATIENT_CLINIC_OR_DEPARTMENT_OTHER): Payer: Self-pay | Admitting: Plastic Surgery

## 2020-10-09 ENCOUNTER — Other Ambulatory Visit: Payer: Self-pay

## 2020-10-09 ENCOUNTER — Ambulatory Visit (INDEPENDENT_AMBULATORY_CARE_PROVIDER_SITE_OTHER): Payer: 59 | Admitting: Plastic Surgery

## 2020-10-09 ENCOUNTER — Encounter: Payer: Self-pay | Admitting: Plastic Surgery

## 2020-10-09 DIAGNOSIS — C50411 Malignant neoplasm of upper-outer quadrant of right female breast: Secondary | ICD-10-CM

## 2020-10-09 NOTE — Progress Notes (Signed)
The patient is a 65 year old female here for follow-up after undergoing removal of right breast expander and placement of implant.  She also had a left mastopexy for improved symmetry.  She has had very little to no pain.  Her bowels are moving and she is doing really well.  There is no sign of a hematoma or seroma.  She may start radiation in the next 2 weeks.  We talked a little bit about how that may change the way things look right now.  We will have to give it time but we will be able to work with that in the next 6 to 12 months.  We will get a picture at her next visit in 10 days.

## 2020-10-15 ENCOUNTER — Ambulatory Visit
Admission: RE | Admit: 2020-10-15 | Discharge: 2020-10-15 | Disposition: A | Discharge status: Home / Self Care | Payer: 59 | Source: Ambulatory Visit | Attending: Radiation Oncology | Admitting: Radiation Oncology

## 2020-10-15 DIAGNOSIS — C50411 Malignant neoplasm of upper-outer quadrant of right female breast: Secondary | ICD-10-CM | POA: Diagnosis present

## 2020-10-15 DIAGNOSIS — Z510 Encounter for antineoplastic radiation therapy: Secondary | ICD-10-CM | POA: Diagnosis not present

## 2020-10-19 NOTE — Progress Notes (Signed)
Patient is a 65 year old female with past medical history of right-sided breast cancer s/p reconstruction with implant placement performed 09/30/2020 who returns to clinic for postoperative appointment.  She was seen most recently on 10/09/2020.  At that time, she had complained of little to no pain.  There is no evidence of hematoma or seroma.  Plan was for postop picture at next appointment.  On my examination, she is accompanied by her husband is at bedside.  She states that she is largely reassured by her surgery outcomes, but does complain of continued skin wrinkling and inferior aspect of right breast reconstruction.  Ptosis on left side is approximately 1 cm below that on the right side.  She states that she had this similar skin wrinkling during expander placement and she was hoping that it would have improved/resolve with implant placement.  Discussed that she is only 3 weeks postop and that there is still settling and gravity to occur which might help fill out the lower aspect of breast reconstruction.    Recommended continued supportive garments and activity modification.  She states that she is moving her arms without discomfort or difficulty.  She denies any redness, fevers or chills, malodor, or functional limitations.  Discussed possibility of fat grafting in the future if skin wrinkling inferiorly continues to persist.  Chaperone at bedside for physical exam.  No cellulitic findings.  No evidence of hematoma or seroma.  Any pictures obtained of the patient and placed in the chart were with the patient's or guardian's permission.

## 2020-10-20 ENCOUNTER — Encounter: Payer: 59 | Admitting: Plastic Surgery

## 2020-10-20 ENCOUNTER — Ambulatory Visit (INDEPENDENT_AMBULATORY_CARE_PROVIDER_SITE_OTHER): Payer: 59 | Admitting: Physician Assistant

## 2020-10-20 ENCOUNTER — Other Ambulatory Visit: Payer: Self-pay

## 2020-10-20 DIAGNOSIS — Z9889 Other specified postprocedural states: Secondary | ICD-10-CM

## 2020-10-21 DIAGNOSIS — Z510 Encounter for antineoplastic radiation therapy: Secondary | ICD-10-CM | POA: Diagnosis not present

## 2020-10-22 ENCOUNTER — Ambulatory Visit: Admission: RE | Admit: 2020-10-22 | Payer: 59 | Source: Ambulatory Visit

## 2020-10-22 DIAGNOSIS — Z510 Encounter for antineoplastic radiation therapy: Secondary | ICD-10-CM | POA: Diagnosis not present

## 2020-10-26 ENCOUNTER — Ambulatory Visit
Admission: RE | Admit: 2020-10-26 | Discharge: 2020-10-26 | Disposition: A | Discharge status: Home / Self Care | Payer: 59 | Source: Ambulatory Visit | Attending: Radiation Oncology | Admitting: Radiation Oncology

## 2020-10-26 DIAGNOSIS — Z510 Encounter for antineoplastic radiation therapy: Secondary | ICD-10-CM | POA: Diagnosis not present

## 2020-10-27 ENCOUNTER — Ambulatory Visit
Admission: RE | Admit: 2020-10-27 | Discharge: 2020-10-27 | Disposition: A | Discharge status: Home / Self Care | Payer: 59 | Source: Ambulatory Visit | Attending: Radiation Oncology | Admitting: Radiation Oncology

## 2020-10-27 DIAGNOSIS — Z510 Encounter for antineoplastic radiation therapy: Secondary | ICD-10-CM | POA: Diagnosis not present

## 2020-10-28 ENCOUNTER — Ambulatory Visit
Admission: RE | Admit: 2020-10-28 | Discharge: 2020-10-28 | Disposition: A | Discharge status: Home / Self Care | Payer: 59 | Source: Ambulatory Visit | Attending: Radiation Oncology | Admitting: Radiation Oncology

## 2020-10-28 DIAGNOSIS — Z510 Encounter for antineoplastic radiation therapy: Secondary | ICD-10-CM | POA: Diagnosis not present

## 2020-10-29 ENCOUNTER — Ambulatory Visit
Admission: RE | Admit: 2020-10-29 | Discharge: 2020-10-29 | Disposition: A | Discharge status: Home / Self Care | Payer: 59 | Source: Ambulatory Visit | Attending: Radiation Oncology | Admitting: Radiation Oncology

## 2020-10-29 DIAGNOSIS — Z510 Encounter for antineoplastic radiation therapy: Secondary | ICD-10-CM | POA: Diagnosis not present

## 2020-10-30 ENCOUNTER — Ambulatory Visit
Admission: RE | Admit: 2020-10-30 | Discharge: 2020-10-30 | Disposition: A | Discharge status: Home / Self Care | Payer: 59 | Source: Ambulatory Visit | Attending: Radiation Oncology | Admitting: Radiation Oncology

## 2020-10-30 DIAGNOSIS — Z510 Encounter for antineoplastic radiation therapy: Secondary | ICD-10-CM | POA: Diagnosis not present

## 2020-11-02 ENCOUNTER — Ambulatory Visit
Admission: RE | Admit: 2020-11-02 | Discharge: 2020-11-02 | Disposition: A | Discharge status: Home / Self Care | Payer: Medicare HMO | Source: Ambulatory Visit | Attending: Radiation Oncology | Admitting: Radiation Oncology

## 2020-11-02 DIAGNOSIS — C50411 Malignant neoplasm of upper-outer quadrant of right female breast: Secondary | ICD-10-CM | POA: Insufficient documentation

## 2020-11-02 DIAGNOSIS — Z510 Encounter for antineoplastic radiation therapy: Secondary | ICD-10-CM | POA: Insufficient documentation

## 2020-11-03 ENCOUNTER — Ambulatory Visit
Admission: RE | Admit: 2020-11-03 | Discharge: 2020-11-03 | Disposition: A | Discharge status: Home / Self Care | Payer: Medicare HMO | Source: Ambulatory Visit | Attending: Radiation Oncology | Admitting: Radiation Oncology

## 2020-11-03 DIAGNOSIS — Z510 Encounter for antineoplastic radiation therapy: Secondary | ICD-10-CM | POA: Diagnosis not present

## 2020-11-04 ENCOUNTER — Ambulatory Visit
Admission: RE | Admit: 2020-11-04 | Discharge: 2020-11-04 | Disposition: A | Discharge status: Home / Self Care | Payer: Medicare HMO | Source: Ambulatory Visit | Attending: Radiation Oncology | Admitting: Radiation Oncology

## 2020-11-04 DIAGNOSIS — Z510 Encounter for antineoplastic radiation therapy: Secondary | ICD-10-CM | POA: Diagnosis not present

## 2020-11-05 ENCOUNTER — Other Ambulatory Visit: Payer: Self-pay | Admitting: *Deleted

## 2020-11-05 ENCOUNTER — Ambulatory Visit
Admission: RE | Admit: 2020-11-05 | Discharge: 2020-11-05 | Disposition: A | Discharge status: Home / Self Care | Payer: Medicare HMO | Source: Ambulatory Visit | Attending: Radiation Oncology | Admitting: Radiation Oncology

## 2020-11-05 DIAGNOSIS — C50411 Malignant neoplasm of upper-outer quadrant of right female breast: Secondary | ICD-10-CM

## 2020-11-05 DIAGNOSIS — Z510 Encounter for antineoplastic radiation therapy: Secondary | ICD-10-CM | POA: Diagnosis not present

## 2020-11-06 ENCOUNTER — Ambulatory Visit
Admission: RE | Admit: 2020-11-06 | Discharge: 2020-11-06 | Disposition: A | Discharge status: Home / Self Care | Payer: Medicare HMO | Source: Ambulatory Visit | Attending: Radiation Oncology | Admitting: Radiation Oncology

## 2020-11-06 DIAGNOSIS — Z510 Encounter for antineoplastic radiation therapy: Secondary | ICD-10-CM | POA: Diagnosis not present

## 2020-11-09 ENCOUNTER — Ambulatory Visit
Admission: RE | Admit: 2020-11-09 | Discharge: 2020-11-09 | Disposition: A | Discharge status: Home / Self Care | Payer: Medicare HMO | Source: Ambulatory Visit | Attending: Radiation Oncology | Admitting: Radiation Oncology

## 2020-11-09 ENCOUNTER — Inpatient Hospital Stay: Payer: Medicare HMO | Attending: Radiation Oncology

## 2020-11-09 DIAGNOSIS — Z510 Encounter for antineoplastic radiation therapy: Secondary | ICD-10-CM | POA: Diagnosis not present

## 2020-11-09 DIAGNOSIS — C50411 Malignant neoplasm of upper-outer quadrant of right female breast: Secondary | ICD-10-CM | POA: Diagnosis not present

## 2020-11-09 LAB — CBC
HCT: 47.7 % — ABNORMAL HIGH (ref 36.0–46.0)
Hemoglobin: 16.1 g/dL — ABNORMAL HIGH (ref 12.0–15.0)
MCH: 31.8 pg (ref 26.0–34.0)
MCHC: 33.8 g/dL (ref 30.0–36.0)
MCV: 94.1 fL (ref 80.0–100.0)
Platelets: 191 10*3/uL (ref 150–400)
RBC: 5.07 MIL/uL (ref 3.87–5.11)
RDW: 14.2 % (ref 11.5–15.5)
WBC: 6.1 10*3/uL (ref 4.0–10.5)
nRBC: 0 % (ref 0.0–0.2)

## 2020-11-10 ENCOUNTER — Ambulatory Visit
Admission: RE | Admit: 2020-11-10 | Discharge: 2020-11-10 | Disposition: A | Discharge status: Home / Self Care | Payer: Medicare HMO | Source: Ambulatory Visit | Attending: Radiation Oncology | Admitting: Radiation Oncology

## 2020-11-10 DIAGNOSIS — Z510 Encounter for antineoplastic radiation therapy: Secondary | ICD-10-CM | POA: Diagnosis not present

## 2020-11-11 ENCOUNTER — Ambulatory Visit
Admission: RE | Admit: 2020-11-11 | Discharge: 2020-11-11 | Disposition: A | Discharge status: Home / Self Care | Payer: Medicare HMO | Source: Ambulatory Visit | Attending: Radiation Oncology | Admitting: Radiation Oncology

## 2020-11-11 DIAGNOSIS — Z510 Encounter for antineoplastic radiation therapy: Secondary | ICD-10-CM | POA: Diagnosis not present

## 2020-11-12 ENCOUNTER — Ambulatory Visit
Admission: RE | Admit: 2020-11-12 | Discharge: 2020-11-12 | Disposition: A | Discharge status: Home / Self Care | Payer: Medicare HMO | Source: Ambulatory Visit | Attending: Radiation Oncology | Admitting: Radiation Oncology

## 2020-11-12 DIAGNOSIS — Z510 Encounter for antineoplastic radiation therapy: Secondary | ICD-10-CM | POA: Diagnosis not present

## 2020-11-13 ENCOUNTER — Ambulatory Visit
Admission: RE | Admit: 2020-11-13 | Discharge: 2020-11-13 | Disposition: A | Discharge status: Home / Self Care | Payer: Medicare HMO | Source: Ambulatory Visit | Attending: Radiation Oncology | Admitting: Radiation Oncology

## 2020-11-13 DIAGNOSIS — Z510 Encounter for antineoplastic radiation therapy: Secondary | ICD-10-CM | POA: Diagnosis not present

## 2020-11-16 ENCOUNTER — Ambulatory Visit
Admission: RE | Admit: 2020-11-16 | Discharge: 2020-11-16 | Disposition: A | Discharge status: Home / Self Care | Payer: Medicare HMO | Source: Ambulatory Visit | Attending: Radiation Oncology | Admitting: Radiation Oncology

## 2020-11-16 DIAGNOSIS — Z510 Encounter for antineoplastic radiation therapy: Secondary | ICD-10-CM | POA: Diagnosis not present

## 2020-11-17 ENCOUNTER — Ambulatory Visit (INDEPENDENT_AMBULATORY_CARE_PROVIDER_SITE_OTHER): Payer: Medicare HMO | Admitting: Physician Assistant

## 2020-11-17 ENCOUNTER — Other Ambulatory Visit: Payer: Self-pay

## 2020-11-17 ENCOUNTER — Ambulatory Visit
Admission: RE | Admit: 2020-11-17 | Discharge: 2020-11-17 | Disposition: A | Discharge status: Home / Self Care | Payer: Medicare HMO | Source: Ambulatory Visit | Attending: Radiation Oncology | Admitting: Radiation Oncology

## 2020-11-17 ENCOUNTER — Encounter: Payer: Self-pay | Admitting: Physician Assistant

## 2020-11-17 DIAGNOSIS — Z9889 Other specified postprocedural states: Secondary | ICD-10-CM

## 2020-11-17 DIAGNOSIS — Z510 Encounter for antineoplastic radiation therapy: Secondary | ICD-10-CM | POA: Diagnosis not present

## 2020-11-17 NOTE — Progress Notes (Signed)
Patient is a 65 year old female with past medical history of right-sided breast cancer s/p reconstruction with implant placement performed 09/30/2020 who returns to clinic for postoperative appointment.  She also had left-sided mastopexy performed.  Patient was seen most recently here in clinic on 10/20/2020.  At that time, she complained of continued skin wrinkling along inferior aspect of her right breast reconstruction.  Left breast appeared mildly more ptotic.  There were no cellulitic findings or evidence of subcutaneous fluid collections appreciated on exam.  Patient was only 3 weeks postop and plan was for continued conservative management.    Today, patient is doing well.  She feels as though she still has asymmetry, but that she does not particular concerns.  She is overall pleased with the outcome and denies any current complaints.  She states that her pain has been well controlled and she has already been able to resume normal activity.  She continues to do stretching exercises with right upper extremity.  Denies any fevers, redness, streaking, swelling, pain, or other symptoms.  Physical exam was largely reassuring.  Breasts are nontender bilaterally.  No redness or other overlying skin changes.  Incisions have healed well.  No masses noted.  Her left breast does appear to be slightly more ptotic than right, approximately 1.5 cm.  It does appear to be improved since mastopexy compared to preop photos.  Additionally, anticipate continued settling of right breast which may contribute to diminished asymmetry.  No specific follow-up required.  Instead, discussed that she will continue to have settling over the course of the next few months.  At that time, she continues to have asymmetry or concerns regarding contour, she can schedule an appointment for consult.  Overall, patient is pleased.  Picture(s) obtained of the patient and placed in the chart were with the patient's or guardian's  permission.

## 2020-11-18 ENCOUNTER — Ambulatory Visit: Admission: RE | Admit: 2020-11-18 | Payer: Medicare HMO | Source: Ambulatory Visit

## 2020-11-18 DIAGNOSIS — Z510 Encounter for antineoplastic radiation therapy: Secondary | ICD-10-CM | POA: Diagnosis not present

## 2020-11-19 ENCOUNTER — Ambulatory Visit: Payer: Medicare HMO

## 2020-11-20 ENCOUNTER — Ambulatory Visit: Payer: Medicare HMO

## 2020-11-23 ENCOUNTER — Ambulatory Visit: Payer: Medicare HMO

## 2020-11-23 ENCOUNTER — Inpatient Hospital Stay: Payer: Medicare HMO

## 2020-11-24 ENCOUNTER — Ambulatory Visit
Admission: RE | Admit: 2020-11-24 | Discharge: 2020-11-24 | Disposition: A | Discharge status: Home / Self Care | Payer: Medicare HMO | Source: Ambulatory Visit | Attending: Radiation Oncology | Admitting: Radiation Oncology

## 2020-11-24 DIAGNOSIS — Z510 Encounter for antineoplastic radiation therapy: Secondary | ICD-10-CM | POA: Diagnosis not present

## 2020-11-25 ENCOUNTER — Ambulatory Visit
Admission: RE | Admit: 2020-11-25 | Discharge: 2020-11-25 | Disposition: A | Discharge status: Home / Self Care | Payer: Medicare HMO | Source: Ambulatory Visit | Attending: Radiation Oncology | Admitting: Radiation Oncology

## 2020-11-25 DIAGNOSIS — Z510 Encounter for antineoplastic radiation therapy: Secondary | ICD-10-CM | POA: Diagnosis not present

## 2020-11-26 ENCOUNTER — Ambulatory Visit
Admission: RE | Admit: 2020-11-26 | Discharge: 2020-11-26 | Disposition: A | Discharge status: Home / Self Care | Payer: Medicare HMO | Source: Ambulatory Visit | Attending: Radiation Oncology | Admitting: Radiation Oncology

## 2020-11-26 DIAGNOSIS — Z510 Encounter for antineoplastic radiation therapy: Secondary | ICD-10-CM | POA: Diagnosis not present

## 2020-11-27 ENCOUNTER — Ambulatory Visit
Admission: RE | Admit: 2020-11-27 | Discharge: 2020-11-27 | Disposition: A | Discharge status: Home / Self Care | Payer: Medicare HMO | Source: Ambulatory Visit | Attending: Radiation Oncology | Admitting: Radiation Oncology

## 2020-11-27 DIAGNOSIS — Z510 Encounter for antineoplastic radiation therapy: Secondary | ICD-10-CM | POA: Diagnosis not present

## 2020-11-30 ENCOUNTER — Ambulatory Visit
Admission: RE | Admit: 2020-11-30 | Discharge: 2020-11-30 | Disposition: A | Discharge status: Home / Self Care | Payer: Medicare HMO | Source: Ambulatory Visit | Attending: Radiation Oncology | Admitting: Radiation Oncology

## 2020-11-30 ENCOUNTER — Inpatient Hospital Stay: Payer: Medicare HMO

## 2020-11-30 ENCOUNTER — Other Ambulatory Visit: Payer: Self-pay

## 2020-11-30 DIAGNOSIS — C50411 Malignant neoplasm of upper-outer quadrant of right female breast: Secondary | ICD-10-CM | POA: Diagnosis not present

## 2020-11-30 DIAGNOSIS — Z510 Encounter for antineoplastic radiation therapy: Secondary | ICD-10-CM | POA: Diagnosis not present

## 2020-11-30 LAB — CBC
HCT: 46.6 % — ABNORMAL HIGH (ref 36.0–46.0)
Hemoglobin: 16 g/dL — ABNORMAL HIGH (ref 12.0–15.0)
MCH: 32.5 pg (ref 26.0–34.0)
MCHC: 34.3 g/dL (ref 30.0–36.0)
MCV: 94.7 fL (ref 80.0–100.0)
Platelets: 168 10*3/uL (ref 150–400)
RBC: 4.92 MIL/uL (ref 3.87–5.11)
RDW: 14.2 % (ref 11.5–15.5)
WBC: 5.3 10*3/uL (ref 4.0–10.5)
nRBC: 0 % (ref 0.0–0.2)

## 2020-12-01 ENCOUNTER — Ambulatory Visit
Admission: RE | Admit: 2020-12-01 | Discharge: 2020-12-01 | Disposition: A | Discharge status: Home / Self Care | Payer: Medicare HMO | Source: Ambulatory Visit | Attending: Radiation Oncology | Admitting: Radiation Oncology

## 2020-12-01 DIAGNOSIS — C50411 Malignant neoplasm of upper-outer quadrant of right female breast: Secondary | ICD-10-CM | POA: Insufficient documentation

## 2020-12-01 DIAGNOSIS — Z510 Encounter for antineoplastic radiation therapy: Secondary | ICD-10-CM | POA: Insufficient documentation

## 2020-12-02 ENCOUNTER — Ambulatory Visit
Admission: RE | Admit: 2020-12-02 | Discharge: 2020-12-02 | Disposition: A | Discharge status: Home / Self Care | Payer: Medicare HMO | Source: Ambulatory Visit | Attending: Radiation Oncology | Admitting: Radiation Oncology

## 2020-12-02 DIAGNOSIS — Z510 Encounter for antineoplastic radiation therapy: Secondary | ICD-10-CM | POA: Diagnosis not present

## 2020-12-03 ENCOUNTER — Ambulatory Visit
Admission: RE | Admit: 2020-12-03 | Discharge: 2020-12-03 | Disposition: A | Discharge status: Home / Self Care | Payer: Medicare HMO | Source: Ambulatory Visit | Attending: Radiation Oncology | Admitting: Radiation Oncology

## 2020-12-03 DIAGNOSIS — Z510 Encounter for antineoplastic radiation therapy: Secondary | ICD-10-CM | POA: Diagnosis not present

## 2020-12-03 NOTE — Progress Notes (Signed)
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  Oncotype score 14, low risk.  INTERVAL HISTORY: Patient returns to clinic today at the conclusion of her XRT for further evaluation and initiation of letrozole.  Other than some mild breast erythema, she tolerated her treatments well.  She currently feels well and is asymptomatic. She has no neurologic complaints.  She denies any recent fevers or illnesses.  She has a good appetite and denies weight loss.  She has no chest pain, shortness of breath, cough, or hemoptysis.  She denies any nausea, vomiting, constipation, or diarrhea.  She has no urinary complaints.  Patient offers no specific complaints today.  REVIEW OF SYSTEMS:   Review of Systems  Constitutional: Negative.  Negative for fever, malaise/fatigue and weight loss.  Respiratory: Negative.  Negative for cough and hemoptysis.   Cardiovascular: Negative.  Negative for chest pain and leg swelling.  Gastrointestinal: Negative.  Negative for abdominal pain.  Genitourinary: Negative.   Musculoskeletal: Negative.  Negative for back pain.  Skin: Negative.  Negative for rash.  Neurological: Negative.  Negative for dizziness, focal weakness, weakness and headaches.  Psychiatric/Behavioral: Negative.  The patient is not nervous/anxious.    As per HPI. Otherwise, a complete review of systems is negative.  PAST MEDICAL HISTORY: Past Medical History:  Diagnosis Date   Arthritis    fingers   Breast mass, right 12/20/2017   PLEOMORPHIC LOBULAR CARCINOMA IN SITU   Cancer ([place])    right breast ILC   Complication of anesthesia    PT STATES THAT ANESTHESIA HAS  ALWAYS USE THE ANESTHESIA THAT IS GIVEN TO HEART PATIENTS   Fluttering heart    GERD (gastroesophageal reflux disease)    Heart murmur    History of methicillin resistant staphylococcus aureus (MRSA)    PONV (postoperative nausea and vomiting)    patient denies PONV 06/08/20   Seasonal allergies    Thyroid nodule     PAST SURGICAL HISTORY: Past Surgical History:  Procedure Laterality Date   AXILLARY LYMPH NODE DISSECTION Right 07/29/2020   Procedure: AXILLARY LYMPH NODE DISSECTION;  Surgeon: Robert Bellow, MD;  Location: [facility];  Service: General;  Laterality: Right;  (no SLN)   BREAST BIOPSY Right 12/20/2017   stereo bx calcs/x clip/ PLEOMORPHIC LOBULAR CARCINOMA IN SITU   BREAST BIOPSY Right 04/09/2020   u/s bx path pending 10:00 6cmfn Q clip   BREAST CYST ASPIRATION Bilateral 2003   neg   BREAST LUMPECTOMY Right 03/26/2018   LCIS   BREAST LUMPECTOMY WITH NEEDLE LOCALIZATION Right 03/26/2018   Procedure: RIGHT BREAST WIDE EXCISION WITH NEEDLE LOCALIZATION;  Surgeon: Robert Bellow, MD;  Location: [facility];  Service: General;  Laterality: Right;   BREAST RECONSTRUCTION WITH PLACEMENT OF TISSUE EXPANDER AND FLEX HD (ACELLULAR HYDRATED DERMIS) Right 06/17/2020   Procedure: IMMEDIATE RIGHT BREAST RECONSTRUCTION WITH PLACEMENT OF TISSUE EXPANDER AND FLEX HD (ACELLULAR HYDRATED DERMIS);  Surgeon: Wallace Going, DO;  Location: [facility];  Service: Plastics;  Laterality: Right;   COLONOSCOPY  2015   EYE SURGERY     BIL   MASTOPEXY Left 09/30/2020   Procedure: LEFT BREAST MASTOPEXY;  Surgeon: Wallace Going, DO;  Location:  [place];  Service: Plastics;  Laterality: Left;   REMOVAL OF TISSUE EXPANDER AND PLACEMENT OF IMPLANT Right 09/30/2020   Procedure: REMOVAL OF TISSUE EXPANDER AND PLACEMENT OF IMPLANT RIGHT BREAST;  Surgeon: Wallace Going, DO;  Location: [place];  Service: Plastics;  Laterality: Right;  2 hours   SIMPLE  MASTECTOMY WITH AXILLARY SENTINEL NODE BIOPSY Right 06/17/2020   Procedure: SIMPLE MASTECTOMY WITH AXILLARY SENTINEL NODE BIOPSY;  Surgeon: Robert Bellow, MD;  Location: [facility];  Service: General;  Laterality: Right;   TONSILLECTOMY      FAMILY HISTORY: Family History  Problem Relation Age of Onset   Hypertension Mother    Heart disease Father    Breast cancer Sister     ADVANCED DIRECTIVES (Y/N):  N  HEALTH MAINTENANCE: Social History   Tobacco Use   Smoking status: Never   Smokeless tobacco: Never  Vaping Use   Vaping Use: Never used  Substance Use Topics   Alcohol use: Yes    Alcohol/week: 14.0 standard drinks    Types: 14 Shots of liquor per week    Comment: couple of drinks/day   Drug use: No     Colonoscopy:  PAP:  Bone density:  Lipid panel:  Allergies  Allergen Reactions   Pseudoephedrine Other (See Comments)    palpitations   Penicillins Rash         Current Outpatient Medications  Medication Sig Dispense Refill   Dermatological Products, Misc. (STRATA XRT) GEL Apply 1 application topically 2 (two) times daily as needed. 50 g 2   letrozole (FEMARA) 2.5 MG tablet Take 1 tablet (2.5 mg total) by mouth daily. 90 tablet 3   magnesium 30 MG tablet Take 30 mg by mouth 2 (two) times daily.     Olopatadine HCl (PATADAY OP) Place 1 drop into both eyes daily.     OVER THE COUNTER MEDICATION Take 1 tablet by mouth daily. Chelated zinc with copper     PAPAYA ENZYME PO Take 1 tablet by mouth daily as needed (indigestion).     triamcinolone (NASACORT) 55 MCG/ACT AERO nasal inhaler Place 1 spray into the nose daily.     TYRVAYA 0.03 MG/ACT SOLN Place 1 puff into both nostrils 2 (two) times daily.     XIIDRA 5 % SOLN      No current facility-administered medications for this visit.    OBJECTIVE: Vitals:   12/08/20 1430  BP: 133/80  Pulse: 80  Resp: 16  Temp: (!) 97.2 F (36.2 C)  SpO2: 99%      Body mass index is 18.71 kg/m.    ECOG FS:0 -  Asymptomatic  General: Well-developed, well-nourished, no acute distress. Eyes: Pink conjunctiva, anicteric sclera. HEENT: Normocephalic, moist mucous membranes. Breast: Exam deferred today. Lungs: No audible wheezing or coughing. Heart: Regular rate and rhythm. Abdomen: Soft, nontender, no obvious distention. Musculoskeletal: No edema, cyanosis, or clubbing. Neuro: Alert, answering all questions appropriately. Cranial nerves grossly intact. Skin: No rashes or petechiae noted. Psych: Normal affect.   LAB RESULTS:  Lab Results  Component Value Date   NA 140 06/04/2015   K 3.4 (L) 06/04/2015   CL 102 06/04/2015   CO2 25 06/04/2015   GLUCOSE 105 (H) 06/04/2015   BUN 18 06/04/2015   CREATININE 0.93 06/04/2015   CALCIUM 10.2 06/04/2015   PROT 7.7 06/04/2015   ALBUMIN 5.0 06/04/2015   AST 30 06/04/2015   ALT 18 06/04/2015   ALKPHOS 49 06/04/2015  BILITOT 2.3 (H) 06/04/2015   GFRNONAA >60 06/04/2015   GFRAA >60 06/04/2015    Lab Results  Component Value Date   WBC 5.3 11/30/2020   NEUTROABS 3.4 06/04/2015   HGB 16.0 (H) 11/30/2020   HCT 46.6 (H) 11/30/2020   MCV 94.7 11/30/2020   PLT 168 11/30/2020     STUDIES: No results found.   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  Oncotype DX score 14, low risk.   PLAN:    1. 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  She did not undergo any adjuvant treatment with letrozole or XRT at that time.  Patient subsequently had local recurrence and underwent simple mastectomy on Jun 17, 2020 with 1 lymph node positive for disease at that time.  She then underwent axillary dissection on July 29, 2020 and noted to have 2 additional lymph nodes positive for disease for a total of 3.  Although she had full mastectomy, she underwent  adjuvant XRT given her positive axillary lymph nodes completing treatment on December 07, 2020.  Adjuvant chemotherapy was not necessary given her low risk Oncotype score.  Patient was given a prescription for letrozole today which she will take for a total of 5 years completing treatment in November 2027.  Patient had video-assisted telemedicine visit in 3 months for routine evaluation.   2.  Bone health: Patient underwent bone mineral density on May 13, 2020 that revealed a T score of -0.2 which is considered normal.  Repeat in April 2024.  I spent a total of 20 minutes reviewing chart data, face-to-face evaluation with the patient, counseling and coordination of care as detailed above.    Patient expressed understanding and was in agreement with this plan. She also understands that She can call clinic at any time with any questions, concerns, or complaints.   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

## 2020-12-04 ENCOUNTER — Ambulatory Visit
Admission: RE | Admit: 2020-12-04 | Discharge: 2020-12-04 | Disposition: A | Discharge status: Home / Self Care | Payer: Medicare HMO | Source: Ambulatory Visit | Attending: Radiation Oncology | Admitting: Radiation Oncology

## 2020-12-04 DIAGNOSIS — Z510 Encounter for antineoplastic radiation therapy: Secondary | ICD-10-CM | POA: Diagnosis not present

## 2020-12-07 ENCOUNTER — Ambulatory Visit
Admission: RE | Admit: 2020-12-07 | Discharge: 2020-12-07 | Disposition: A | Discharge status: Home / Self Care | Payer: Medicare HMO | Source: Ambulatory Visit | Attending: Radiation Oncology | Admitting: Radiation Oncology

## 2020-12-07 ENCOUNTER — Other Ambulatory Visit: Payer: Self-pay | Admitting: *Deleted

## 2020-12-07 ENCOUNTER — Telehealth: Payer: Self-pay | Admitting: Oncology

## 2020-12-07 DIAGNOSIS — Z510 Encounter for antineoplastic radiation therapy: Secondary | ICD-10-CM | POA: Diagnosis not present

## 2020-12-07 MED ORDER — STRATA XRT EX GEL: 1.0000 "application " | Freq: Two times a day (BID) | CUTANEOUS | 2 refills | Status: DC | PRN

## 2020-12-07 NOTE — Telephone Encounter (Signed)
Pt called in to cancel her appt for 11-8. Or to see if she can do a video after 4pm. Please give her a call at [phone]

## 2020-12-08 ENCOUNTER — Inpatient Hospital Stay: Payer: Medicare HMO | Attending: Oncology | Admitting: Oncology

## 2020-12-08 ENCOUNTER — Encounter: Payer: Self-pay | Admitting: Oncology

## 2020-12-08 ENCOUNTER — Other Ambulatory Visit: Payer: Self-pay

## 2020-12-08 ENCOUNTER — Other Ambulatory Visit: Payer: Self-pay | Admitting: *Deleted

## 2020-12-08 VITALS — BP 133/80 | HR 80 | Temp 97.2°F | Resp 16 | Wt 105.6 lb

## 2020-12-08 DIAGNOSIS — Z8249 Family history of ischemic heart disease and other diseases of the circulatory system: Secondary | ICD-10-CM | POA: Insufficient documentation

## 2020-12-08 DIAGNOSIS — Z79899 Other long term (current) drug therapy: Secondary | ICD-10-CM | POA: Diagnosis not present

## 2020-12-08 DIAGNOSIS — Z170 Estrogen receptor positive status [ER+]: Secondary | ICD-10-CM | POA: Insufficient documentation

## 2020-12-08 DIAGNOSIS — Z803 Family history of malignant neoplasm of breast: Secondary | ICD-10-CM | POA: Diagnosis not present

## 2020-12-08 DIAGNOSIS — Z7289 Other problems related to lifestyle: Secondary | ICD-10-CM | POA: Insufficient documentation

## 2020-12-08 DIAGNOSIS — C50411 Malignant neoplasm of upper-outer quadrant of right female breast: Secondary | ICD-10-CM | POA: Diagnosis not present

## 2020-12-08 DIAGNOSIS — Z880 Allergy status to penicillin: Secondary | ICD-10-CM | POA: Insufficient documentation

## 2020-12-08 DIAGNOSIS — Z8614 Personal history of Methicillin resistant Staphylococcus aureus infection: Secondary | ICD-10-CM | POA: Insufficient documentation

## 2020-12-08 MED ORDER — LETROZOLE 2.5 MG PO TABS: 2.5000 mg | ORAL_TABLET | Freq: Every day | ORAL | 3 refills | Status: DC

## 2020-12-08 NOTE — Progress Notes (Signed)
Pt has no concerns/complaints at this time. 

## 2021-01-07 ENCOUNTER — Other Ambulatory Visit: Payer: Self-pay

## 2021-01-07 ENCOUNTER — Ambulatory Visit
Admission: RE | Admit: 2021-01-07 | Discharge: 2021-01-07 | Disposition: A | Discharge status: Home / Self Care | Payer: Medicare HMO | Source: Ambulatory Visit | Attending: Radiation Oncology | Admitting: Radiation Oncology

## 2021-01-07 ENCOUNTER — Encounter: Payer: Self-pay | Admitting: Radiation Oncology

## 2021-01-07 VITALS — BP 126/71 | HR 73 | Temp 97.9°F | Resp 18 | Wt 105.7 lb

## 2021-01-07 DIAGNOSIS — C773 Secondary and unspecified malignant neoplasm of axilla and upper limb lymph nodes: Secondary | ICD-10-CM | POA: Diagnosis not present

## 2021-01-07 DIAGNOSIS — Z923 Personal history of irradiation: Secondary | ICD-10-CM | POA: Diagnosis not present

## 2021-01-07 DIAGNOSIS — Z79811 Long term (current) use of aromatase inhibitors: Secondary | ICD-10-CM | POA: Insufficient documentation

## 2021-01-07 DIAGNOSIS — Z170 Estrogen receptor positive status [ER+]: Secondary | ICD-10-CM | POA: Insufficient documentation

## 2021-01-07 DIAGNOSIS — C50411 Malignant neoplasm of upper-outer quadrant of right female breast: Secondary | ICD-10-CM | POA: Insufficient documentation

## 2021-01-07 NOTE — Progress Notes (Signed)
Radiation Oncology Follow up Note  Name: Sarah Hurley   Date:   01/07/2021 MRN:  [id number] DOB: Jun 22, 1955    This 65 y.o. female presents to the clinic today for 1 month follow-up status post whole breast radiation to her right breast for invasive lobular carcinoma stage IIb (T2 cN1 M0) ER/PR positive HER2/neu negative.  REFERRING PROVIDER: Maryland Pink, MD  HPI: Patient is a 65 year old female now out 1 month having completed whole breast and peripheral lymphatic radiation therapy to her right breast for invasive lobular carcinoma status post mastectomy and breast reconstruction.  She had metastatic disease macroscopic in 3 lymph nodes and then had a formal axillary node dissection with 2 of 13 nodes again positive for metastatic disease.  Oncotype DX showed no benefit for chemotherapy she underwent chest wall peripheral emphatic radiation to her reconstructed right breast.  Seen today in routine follow-up she is doing well.  She specifically denies any breast tenderness cough or bone pain..  She has been started on Femara tolerating it well without side effect.  COMPLICATIONS OF TREATMENT: none  FOLLOW UP COMPLIANCE: keeps appointments  Patient is a reconstructed right breast no dominant masses noted in either breast.  No axillary or supraclavicular adenopathy is identified.  No evidence of lymphedema in either upper extremity is noted.  Well-developed well-nourished patient in NAD. HEENT reveals PERLA, EOMI, discs not visualized.  Oral cavity is clear. No oral mucosal lesions are identified. Neck is clear without evidence of cervical or supraclavicular adenopathy. Lungs are clear to A&P. Cardiac examination is essentially unremarkable with regular rate and rhythm without murmur rub or thrill. Abdomen is benign with no organomegaly or masses noted. Motor sensory and DTR levels are equal and symmetric in the upper and lower extremities. Cranial nerves II through XII are grossly intact.  Proprioception is intact. No peripheral adenopathy or edema is identified. No motor or sensory levels are noted. Crude visual fields are within normal range. PHYSICAL EXAM:  BP 126/71 (BP Location: Left Arm, Patient Position: Sitting)   Pulse 73   Temp 97.9 F (36.6 C) (Tympanic)   Resp 18   Wt 105 lb 11.2 oz (47.9 kg)   BMI 18.72 kg/m    RADIOLOGY RESULTS: No current films to review  PLAN: Present time patient is doing well 1 month out from whole breast radiation to her reconstructed right breast and peripheral lymphatics.  She is doing well without side effect or complaint.  I have asked to see her back in 4 to 5 months for follow-up.  She continues on Femara without side effect.  Patient knows to call with any concerns.  I would like to take this opportunity to thank you for allowing me to participate in the care of your patient.Noreene Filbert, MD

## 2021-03-11 ENCOUNTER — Other Ambulatory Visit: Payer: Self-pay

## 2021-03-11 ENCOUNTER — Inpatient Hospital Stay: Payer: Medicare HMO | Attending: Oncology | Admitting: Oncology

## 2021-03-11 DIAGNOSIS — C50411 Malignant neoplasm of upper-outer quadrant of right female breast: Secondary | ICD-10-CM | POA: Diagnosis not present

## 2021-03-11 NOTE — Progress Notes (Signed)
[place]  Telephone:(336) [phone] Fax:(336) [phone]  ID: Sarah Hurley OB: [birth year]  MR#: [id number]  PJK#:[id number]  Patient Care Team: Sarah Pink, MD as PCP - General (Family Medicine) Sarah Demark, RN as Oncology Nurse Navigator  I connected with Sarah Hurley on 03/12/21 at  2:30 PM EST by video enabled telemedicine visit and verified that I am speaking with the correct person using two identifiers.   I discussed the limitations, risks, security and privacy concerns of performing an evaluation and management service by telemedicine and the availability of in-person appointments. I also discussed with the patient that there may be a patient responsible charge related to this service. The patient expressed understanding and agreed to proceed.   Other persons participating in the visit and their role in the encounter: Patient, MD.  Patients location: Home. Providers location: Clinic.  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  Oncotype score 14, low risk.  INTERVAL HISTORY: Patient agreed to video assisted telemedicine visit for routine 67-month evaluation and assess her toleration of letrozole.  She currently feels well and is asymptomatic.  She is tolerating treatment well without significant side effects.  She has no neurologic complaints.  She denies any recent fevers or illnesses.  She has a good appetite and denies weight loss.  She has no chest pain, shortness of breath, cough, or hemoptysis.  She denies any nausea, vomiting, constipation, or diarrhea.  She has no urinary complaints.  Patient offers no specific complaints today.  REVIEW OF SYSTEMS:   Review of Systems  Constitutional: Negative.  Negative for fever, malaise/fatigue and weight loss.  Respiratory: Negative.  Negative for cough and hemoptysis.   Cardiovascular: Negative.  Negative for chest pain and leg  swelling.  Gastrointestinal: Negative.  Negative for abdominal pain.  Genitourinary: Negative.   Musculoskeletal: Negative.  Negative for back pain.  Skin: Negative.  Negative for rash.  Neurological: Negative.  Negative for dizziness, focal weakness, weakness and headaches.  Psychiatric/Behavioral: Negative.  The patient is not nervous/anxious.    As per HPI. Otherwise, a complete review of systems is negative.  PAST MEDICAL HISTORY: Past Medical History:  Diagnosis Date   Arthritis    fingers   Breast mass, right 12/20/2017   PLEOMORPHIC LOBULAR CARCINOMA IN SITU   Cancer ([place])    right breast ILC   Complication of anesthesia    PT STATES THAT ANESTHESIA HAS ALWAYS USE THE ANESTHESIA THAT IS GIVEN TO HEART PATIENTS   Fluttering heart    GERD (gastroesophageal reflux disease)    Heart murmur    History of methicillin resistant staphylococcus aureus (MRSA)    PONV (postoperative nausea and vomiting)    patient denies PONV 06/08/20   Seasonal allergies    Thyroid nodule     PAST SURGICAL HISTORY: Past Surgical History:  Procedure Laterality Date   AXILLARY LYMPH NODE DISSECTION Right 07/29/2020   Procedure: AXILLARY LYMPH NODE DISSECTION;  Surgeon: Robert Bellow, MD;  Location: [facility];  Service: General;  Laterality: Right;  (no SLN)   BREAST BIOPSY Right 12/20/2017   stereo bx calcs/x clip/ PLEOMORPHIC LOBULAR CARCINOMA IN SITU   BREAST BIOPSY Right 04/09/2020   u/s bx path pending 10:00 6cmfn Q clip   BREAST CYST ASPIRATION Bilateral 2003   neg   BREAST LUMPECTOMY Right 03/26/2018   LCIS   BREAST LUMPECTOMY WITH NEEDLE LOCALIZATION Right 03/26/2018   Procedure: RIGHT BREAST  WIDE EXCISION WITH NEEDLE LOCALIZATION;  Surgeon: Robert Bellow, MD;  Location: [facility];  Service: General;  Laterality: Right;   BREAST RECONSTRUCTION WITH PLACEMENT OF TISSUE EXPANDER AND FLEX HD (ACELLULAR HYDRATED DERMIS) Right 06/17/2020   Procedure: IMMEDIATE RIGHT BREAST  RECONSTRUCTION WITH PLACEMENT OF TISSUE EXPANDER AND FLEX HD (ACELLULAR HYDRATED DERMIS);  Surgeon: Wallace Going, DO;  Location: [facility];  Service: Plastics;  Laterality: Right;   COLONOSCOPY  2015   EYE SURGERY     BIL   MASTOPEXY Left 09/30/2020   Procedure: LEFT BREAST MASTOPEXY;  Surgeon: Wallace Going, DO;  Location: [place];  Service: Plastics;  Laterality: Left;   REMOVAL OF TISSUE EXPANDER AND PLACEMENT OF IMPLANT Right 09/30/2020   Procedure: REMOVAL OF TISSUE EXPANDER AND PLACEMENT OF IMPLANT RIGHT BREAST;  Surgeon: Wallace Going, DO;  Location: [place];  Service: Plastics;  Laterality: Right;  2 hours   SIMPLE MASTECTOMY WITH AXILLARY SENTINEL NODE BIOPSY Right 06/17/2020   Procedure: SIMPLE MASTECTOMY WITH AXILLARY SENTINEL NODE BIOPSY;  Surgeon: Robert Bellow, MD;  Location: [facility];  Service: General;  Laterality: Right;   TONSILLECTOMY      FAMILY HISTORY: Family History  Problem Relation Age of Onset   Hypertension Mother    Heart disease Father    Breast cancer Sister     ADVANCED DIRECTIVES (Y/N):  N  HEALTH MAINTENANCE: Social History   Tobacco Use   Smoking status: Never   Smokeless tobacco: Never  Vaping Use   Vaping Use: Never used  Substance Use Topics   Alcohol use: Yes    Alcohol/week: 14.0 standard drinks    Types: 14 Shots of liquor per week    Comment: couple of drinks/day   Drug use: No     Colonoscopy:  PAP:  Bone density:  Lipid panel:  Allergies  Allergen Reactions   Pseudoephedrine Other (See Comments)    palpitations   Penicillins Rash         Current Outpatient Medications  Medication Sig Dispense Refill   Dermatological Products, Misc. (STRATA XRT) GEL Apply 1 application topically 2 (two) times daily as needed. (Patient not taking: Reported on 01/07/2021) 50 g 2   letrozole (FEMARA) 2.5 MG tablet Take 1 tablet (2.5 mg total) by mouth daily. 90 tablet 3   magnesium  30 MG tablet Take 30 mg by mouth 2 (two) times daily.     Olopatadine HCl (PATADAY OP) Place 1 drop into both eyes daily.     OVER THE COUNTER MEDICATION Take 1 tablet by mouth daily. Chelated zinc with copper     PAPAYA ENZYME PO Take 1 tablet by mouth daily as needed (indigestion).     triamcinolone (NASACORT) 55 MCG/ACT AERO nasal inhaler Place 1 spray into the nose daily.     TYRVAYA 0.03 MG/ACT SOLN Place 1 puff into both nostrils 2 (two) times daily.     XIIDRA 5 % SOLN      No current facility-administered medications for this visit.    OBJECTIVE: There were no vitals filed for this visit.     There is no height or weight on file to calculate BMI.    ECOG FS:0 - Asymptomatic  General: Well-developed, well-nourished, no acute distress. HEENT: Normocephalic. Neuro: Alert, answering all questions appropriately. Cranial nerves grossly intact. Psych: Normal affect.   LAB RESULTS:  Lab Results  Component Value Date   NA 140 06/04/2015   K 3.4 (L)  06/04/2015   CL 102 06/04/2015   CO2 25 06/04/2015   GLUCOSE 105 (H) 06/04/2015   BUN 18 06/04/2015   CREATININE 0.93 06/04/2015   CALCIUM 10.2 06/04/2015   PROT 7.7 06/04/2015   ALBUMIN 5.0 06/04/2015   AST 30 06/04/2015   ALT 18 06/04/2015   ALKPHOS 49 06/04/2015   BILITOT 2.3 (H) 06/04/2015   GFRNONAA >60 06/04/2015   GFRAA >60 06/04/2015    Lab Results  Component Value Date   WBC 5.3 11/30/2020   NEUTROABS 3.4 06/04/2015   HGB 16.0 (H) 11/30/2020   HCT 46.6 (H) 11/30/2020   MCV 94.7 11/30/2020   PLT 168 11/30/2020     STUDIES: No results found.   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  Oncotype DX score 14, low risk.   PLAN:    1. 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  She did not undergo any adjuvant  treatment with letrozole or XRT at that time.  Patient subsequently had local recurrence and underwent simple mastectomy on Jun 17, 2020 with 1 lymph node positive for disease at that time.  She then underwent axillary dissection on July 29, 2020 and noted to have 2 additional lymph nodes positive for disease for a total of 3.  Although she had full mastectomy, she underwent adjuvant XRT given her positive axillary lymph nodes completing treatment on December 07, 2020.  Adjuvant chemotherapy was not necessary given her low risk Oncotype score.  Continue letrozole for a total of 5 years completing treatment in November 2027 although given her node positive disease, can consider extending treatment for 7 to 10 years.  Patient will require repeat bilateral diagnostic mammogram in March 2023.  Return to clinic in 6 months with video assisted telemedicine visit.    2.  Bone health: Patient underwent bone mineral density on May 13, 2020 that revealed a T score of -0.2 which is considered normal.  Repeat in April 2024.  I provided 20 minutes of face-to-face video visit time during this encounter which included chart review, counseling, and coordination of care as documented above.   Patient expressed understanding and was in agreement with this plan. She also understands that She can call clinic at any time with any questions, concerns, or complaints.    Cancer Staging  Carcinoma of upper-outer quadrant of female breast, right 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

## 2021-03-15 ENCOUNTER — Other Ambulatory Visit: Payer: Self-pay | Admitting: General Surgery

## 2021-03-15 DIAGNOSIS — Z1231 Encounter for screening mammogram for malignant neoplasm of breast: Secondary | ICD-10-CM

## 2021-04-08 ENCOUNTER — Other Ambulatory Visit: Payer: Self-pay

## 2021-04-08 ENCOUNTER — Ambulatory Visit
Admission: RE | Admit: 2021-04-08 | Discharge: 2021-04-08 | Disposition: A | Discharge status: Home / Self Care | Payer: Medicare HMO | Source: Ambulatory Visit | Attending: Oncology | Admitting: Oncology

## 2021-04-08 ENCOUNTER — Other Ambulatory Visit: Payer: Self-pay | Admitting: Oncology

## 2021-04-08 DIAGNOSIS — Z1231 Encounter for screening mammogram for malignant neoplasm of breast: Secondary | ICD-10-CM

## 2021-04-08 DIAGNOSIS — C50411 Malignant neoplasm of upper-outer quadrant of right female breast: Secondary | ICD-10-CM | POA: Diagnosis present

## 2021-04-08 DIAGNOSIS — R928 Other abnormal and inconclusive findings on diagnostic imaging of breast: Secondary | ICD-10-CM

## 2021-04-08 DIAGNOSIS — R921 Mammographic calcification found on diagnostic imaging of breast: Secondary | ICD-10-CM

## 2021-05-06 ENCOUNTER — Ambulatory Visit
Admission: RE | Admit: 2021-05-06 | Discharge: 2021-05-06 | Disposition: A | Discharge status: Home / Self Care | Payer: Medicare HMO | Source: Ambulatory Visit | Attending: Oncology | Admitting: Oncology

## 2021-05-06 DIAGNOSIS — R928 Other abnormal and inconclusive findings on diagnostic imaging of breast: Secondary | ICD-10-CM | POA: Insufficient documentation

## 2021-05-06 DIAGNOSIS — R921 Mammographic calcification found on diagnostic imaging of breast: Secondary | ICD-10-CM | POA: Insufficient documentation

## 2021-06-16 ENCOUNTER — Ambulatory Visit: Payer: Medicare HMO | Admitting: Radiation Oncology

## 2021-07-12 ENCOUNTER — Ambulatory Visit
Admission: RE | Admit: 2021-07-12 | Discharge: 2021-07-12 | Disposition: A | Discharge status: Home / Self Care | Payer: Medicare HMO | Source: Ambulatory Visit | Attending: Radiation Oncology | Admitting: Radiation Oncology

## 2021-07-12 ENCOUNTER — Encounter: Payer: Self-pay | Admitting: Radiation Oncology

## 2021-07-12 VITALS — BP 139/92 | HR 85 | Temp 97.6°F | Resp 16 | Ht 63.0 in | Wt 105.4 lb

## 2021-07-12 DIAGNOSIS — Z923 Personal history of irradiation: Secondary | ICD-10-CM | POA: Insufficient documentation

## 2021-07-12 DIAGNOSIS — Z170 Estrogen receptor positive status [ER+]: Secondary | ICD-10-CM | POA: Diagnosis not present

## 2021-07-12 DIAGNOSIS — C50411 Malignant neoplasm of upper-outer quadrant of right female breast: Secondary | ICD-10-CM | POA: Diagnosis present

## 2021-07-12 DIAGNOSIS — Z79811 Long term (current) use of aromatase inhibitors: Secondary | ICD-10-CM | POA: Diagnosis not present

## 2021-07-12 NOTE — Progress Notes (Signed)
Radiation Oncology Follow up Note  Name: Sarah Hurley   Date:   07/12/2021 MRN:  [id number] DOB: [birth year]    This 66 y.o. female presents to the clinic today for 74-monthfollow-up status post whole breast radiation to her right breast for invasive lobular carcinoma stage IIb (T2 N1 M0) ER/PR positive.  REFERRING PROVIDER: HMaryland Pink MD  HPI: Patient is a 66year old female now out 6 months having completed whole breast radiation to her right breast for invasive lobular carcinoma stage IIb ER/PR positive.  Seen today in routine follow-up she is doing well.  She specifically denies breast tenderness cough or bone pain..Marland Kitchen She was noted on recent mammogram to have an 5 mm group of amorphous of amorphous calcifications left upper breast which she and Dr. BTollie Pizzahave decided to just observe at this time.  Patient is currently on Femara tolerating that well without side effect.  COMPLICATIONS OF TREATMENT: none  FOLLOW UP COMPLIANCE: keeps appointments   PHYSICAL EXAM:  BP (!) 139/92 (BP Location: Left Arm, Patient Position: Sitting, Cuff Size: Normal)   Pulse 85   Temp 97.6 F (36.4 C) (Tympanic)   Resp 16   Ht '5\' 3"'$  (1.6 m) Comment: stated wt  Wt 105 lb 6.4 oz (47.8 kg)   BMI 18.67 kg/m  Lungs are clear to A&P cardiac examination essentially unremarkable with regular rate and rhythm. No dominant mass or nodularity is noted in either breast in 2 positions examined. Incision is well-healed. No axillary or supraclavicular adenopathy is appreciated. Cosmetic result is excellent.  Well-developed well-nourished patient in NAD. HEENT reveals PERLA, EOMI, discs not visualized.  Oral cavity is clear. No oral mucosal lesions are identified. Neck is clear without evidence of cervical or supraclavicular adenopathy. Lungs are clear to A&P. Cardiac examination is essentially unremarkable with regular rate and rhythm without murmur rub or thrill. Abdomen is benign with no organomegaly or masses  noted. Motor sensory and DTR levels are equal and symmetric in the upper and lower extremities. Cranial nerves II through XII are grossly intact. Proprioception is intact. No peripheral adenopathy or edema is identified. No motor or sensory levels are noted. Crude visual fields are within normal range.  RADIOLOGY RESULTS: Mammograms reviewed and I compared them to old MRI findings  PLAN: Present time patient is doing well with no evidence of disease.  They are observing her calcifications of her left breast and will have follow-up mammograms in a timely manner.  I have asked to see her back in 6 months for follow-up.  Patient knows to call sooner with any concerns.  I would like to take this opportunity to thank you for allowing me to participate in the care of your patient..Noreene Filbert MD

## 2021-09-02 NOTE — Progress Notes (Signed)
[place]  [phone] [phone]  ID: NOHEALANI MEDINGER OB: 1955/06/04  MR#: [id number]  WER#:[id number]  Patient Care Team: Maryland Pink, MD as PCP - General (Family Medicine) Theodore Demark, RN (Inactive) as Oncology Nurse Navigator  I connected with [name] on 09/10/21 at  3:30 PM EDT by video enabled telemedicine visit and verified that I am speaking with the correct person using two identifiers.   I discussed the limitations, risks, security and privacy concerns of performing an evaluation and management service by telemedicine and the availability of in-person appointments. I also discussed with the patient that there may be a patient responsible charge related to this service. The patient expressed understanding and agreed to proceed.   Other persons participating in the visit and their role in the encounter: Patient, MD.  Patient's location: [place]. Provider's location: Clinic.  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  Oncotype score 14, low risk.  INTERVAL HISTORY: Patient agreed to video assisted telemedicine visit for routine 73-month evaluation.  Patient continued to have hot flashes with her letrozole, but since taking 1/2 pill per daily her side effects have resolved.  She currently feels well and is asymptomatic.  She has no neurologic complaints.  She denies any recent fevers or illnesses.  She has a good appetite and denies weight loss.  She has no chest pain, shortness of breath, cough, or hemoptysis.  She denies any nausea, vomiting, constipation, or diarrhea.  She has no urinary complaints.  Patient offers no further specific complaints today.  REVIEW OF SYSTEMS:   Review of Systems  Constitutional: Negative.  Negative for fever, malaise/fatigue and weight loss.  Respiratory: Negative.  Negative for cough and hemoptysis.   Cardiovascular:  Negative.  Negative for chest pain and leg swelling.  Gastrointestinal: Negative.  Negative for abdominal pain.  Genitourinary: Negative.   Musculoskeletal: Negative.  Negative for back pain.  Skin: Negative.  Negative for rash.  Neurological: Negative.  Negative for dizziness, focal weakness, weakness and headaches.  Psychiatric/Behavioral: Negative.  The patient is not nervous/anxious.     As per HPI. Otherwise, a complete review of systems is negative.  PAST MEDICAL HISTORY: Past Medical History:  Diagnosis Date   Arthritis    fingers   Breast mass, right 12/20/2017   PLEOMORPHIC LOBULAR CARCINOMA IN SITU   Cancer ([place])    right breast ILC   Complication of anesthesia    PT STATES THAT ANESTHESIA HAS ALWAYS USE THE ANESTHESIA THAT IS GIVEN TO HEART PATIENTS   Fluttering heart    GERD (gastroesophageal reflux disease)    Heart murmur    History of methicillin resistant staphylococcus aureus (MRSA)    PONV (postoperative nausea and vomiting)    patient denies PONV 06/08/20   Seasonal allergies    Thyroid nodule     PAST SURGICAL HISTORY: Past Surgical History:  Procedure Laterality Date   AXILLARY LYMPH NODE DISSECTION Right 07/29/2020   Procedure: AXILLARY LYMPH NODE DISSECTION;  Surgeon: Robert Bellow, MD;  Location: [facility];  Service: General;  Laterality: Right;  (no SLN)   BREAST BIOPSY Right 12/20/2017   stereo bx calcs/x clip/ PLEOMORPHIC LOBULAR CARCINOMA IN SITU   BREAST BIOPSY Right 04/09/2020   u/s bx path pending 10:00 6cmfn Q clip   BREAST CYST ASPIRATION Bilateral 2003   neg   BREAST LUMPECTOMY Right 03/26/2018   LCIS   BREAST LUMPECTOMY  WITH NEEDLE LOCALIZATION Right 03/26/2018   Procedure: RIGHT BREAST WIDE EXCISION WITH NEEDLE LOCALIZATION;  Surgeon: Robert Bellow, MD;  Location: [facility];  Service: General;  Laterality: Right;   BREAST RECONSTRUCTION WITH PLACEMENT OF TISSUE EXPANDER AND FLEX HD (ACELLULAR HYDRATED DERMIS) Right 06/17/2020    Procedure: IMMEDIATE RIGHT BREAST RECONSTRUCTION WITH PLACEMENT OF TISSUE EXPANDER AND FLEX HD (ACELLULAR HYDRATED DERMIS);  Surgeon: Wallace Going, DO;  Location: [facility];  Service: Plastics;  Laterality: Right;   COLONOSCOPY  2015   EYE SURGERY     BIL   mastecomy Right 06/17/2020   mastecomy with implant with radiation.   MASTOPEXY Left 09/30/2020   Procedure: LEFT BREAST MASTOPEXY;  Surgeon: Wallace Going, DO;  Location: [facility];  Service: Plastics;  Laterality: Left;   REMOVAL OF TISSUE EXPANDER AND PLACEMENT OF IMPLANT Right 09/30/2020   Procedure: REMOVAL OF TISSUE EXPANDER AND PLACEMENT OF IMPLANT RIGHT BREAST;  Surgeon: Wallace Going, DO;  Location: [place];  Service: Plastics;  Laterality: Right;  2 hours   SIMPLE MASTECTOMY WITH AXILLARY SENTINEL NODE BIOPSY Right 06/17/2020   Procedure: SIMPLE MASTECTOMY WITH AXILLARY SENTINEL NODE BIOPSY;  Surgeon: Robert Bellow, MD;  Location: [facility];  Service: General;  Laterality: Right;   TONSILLECTOMY      FAMILY HISTORY: Family History  Problem Relation Age of Onset   Hypertension Mother    Heart disease Father    Breast cancer Sister     ADVANCED DIRECTIVES (Y/N):  N  HEALTH MAINTENANCE: Social History   Tobacco Use   Smoking status: Never   Smokeless tobacco: Never  Vaping Use   Vaping Use: Never used  Substance Use Topics   Alcohol use: Yes    Alcohol/week: 14.0 standard drinks of alcohol    Types: 14 Shots of liquor per week    Comment: couple of drinks/day   Drug use: No     Colonoscopy:  PAP:  Bone density:  Lipid panel:  Allergies  Allergen Reactions   Pseudoephedrine Other (See Comments)    palpitations   Penicillins Rash         Current Outpatient Medications  Medication Sig Dispense Refill   Dermatological Products, Misc. (STRATA XRT) GEL Apply 1 application topically 2 (two) times daily as needed. 50 g 2   letrozole (FEMARA) 2.5 MG  tablet Take 1 tablet (2.5 mg total) by mouth daily. 90 tablet 3   magnesium 30 MG tablet Take 30 mg by mouth 2 (two) times daily.     Olopatadine HCl (PATADAY OP) Place 1 drop into both eyes daily.     OVER THE COUNTER MEDICATION Take 1 tablet by mouth daily. Chelated zinc with copper     PAPAYA ENZYME PO Take 1 tablet by mouth daily as needed (indigestion).     triamcinolone (NASACORT) 55 MCG/ACT AERO nasal inhaler Place 1 spray into the nose daily.     TYRVAYA 0.03 MG/ACT SOLN Place 1 puff into both nostrils 2 (two) times daily.     XIIDRA 5 % SOLN      No current facility-administered medications for this visit.    OBJECTIVE: There were no vitals filed for this visit.     There is no height or weight on file to calculate BMI.    ECOG FS:0 - Asymptomatic  General: Well-developed, well-nourished, no acute distress. HEENT: Normocephalic. Neuro: Alert, answering all questions appropriately. Cranial nerves grossly intact. Psych: Normal affect.  LAB RESULTS:  Lab Results  Component Value Date   NA 140 06/04/2015   K 3.4 (L) 06/04/2015   CL 102 06/04/2015   CO2 25 06/04/2015   GLUCOSE 105 (H) 06/04/2015   BUN 18 06/04/2015   CREATININE 0.93 06/04/2015   CALCIUM 10.2 06/04/2015   PROT 7.7 06/04/2015   ALBUMIN 5.0 06/04/2015   AST 30 06/04/2015   ALT 18 06/04/2015   ALKPHOS 49 06/04/2015   BILITOT 2.3 (H) 06/04/2015   GFRNONAA >60 06/04/2015   GFRAA >60 06/04/2015    Lab Results  Component Value Date   WBC 5.3 11/30/2020   NEUTROABS 3.4 06/04/2015   HGB 16.0 (H) 11/30/2020   HCT 46.6 (H) 11/30/2020   MCV 94.7 11/30/2020   PLT 168 11/30/2020     STUDIES: No results found.   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  Oncotype DX score 14, low risk.   PLAN:    1. Stage Ib ER/PR positive, HER-2 negative invasive carcinoma upper outer quadrant of the right breast: Patient previously underwent  lumpectomy for LCIS on March 26, 2018.  She did not undergo any adjuvant treatment with letrozole or XRT at that time.  Patient subsequently had local recurrence and underwent simple mastectomy on Jun 17, 2020 with 1 lymph node positive for disease at that time.  She then underwent axillary dissection on July 29, 2020 and noted to have 2 additional lymph nodes positive for disease for a total of 3.  Although she had full mastectomy, she underwent adjuvant XRT given her positive axillary lymph nodes completing treatment on December 07, 2020.  Adjuvant chemotherapy was not necessary given her low risk Oncotype score.  Patient has dose reduced her letrozole to 1/2 pill/day.  Patient expressed understanding that this is not the recommended dosing.  Continue treatment for minimum of 5 years completing in November 2027.  Given her node positive disease, can consider extending treatment for 7 to 10 years.  Patient underwent mammogram on May 06, 2021 which was reported as BI-RADS 4, but no biopsy was completed.  Repeat in October 2023.  Return to clinic in 6 months with video-assisted telemedicine visit.  2.  Bone health: Patient underwent bone mineral density on May 13, 2020 that revealed a T score of -0.2 which is considered normal.  Repeat in April 2024.  I provided 20 minutes of face-to-face video visit time during this encounter which included chart review, counseling, and coordination of care as documented above.    Patient expressed understanding and was in agreement with this plan. She also understands that She can call clinic at any time with any questions, concerns, or complaints.    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

## 2021-09-09 ENCOUNTER — Inpatient Hospital Stay: Payer: Medicare HMO | Attending: Oncology | Admitting: Oncology

## 2021-09-09 DIAGNOSIS — C50411 Malignant neoplasm of upper-outer quadrant of right female breast: Secondary | ICD-10-CM

## 2021-10-11 ENCOUNTER — Other Ambulatory Visit: Payer: Self-pay | Admitting: General Surgery

## 2021-10-11 DIAGNOSIS — C50411 Malignant neoplasm of upper-outer quadrant of right female breast: Secondary | ICD-10-CM

## 2021-11-08 ENCOUNTER — Other Ambulatory Visit: Payer: Self-pay | Admitting: General Surgery

## 2021-11-08 ENCOUNTER — Ambulatory Visit
Admission: RE | Admit: 2021-11-08 | Discharge: 2021-11-08 | Disposition: A | Discharge status: Home / Self Care | Payer: Medicare HMO | Source: Ambulatory Visit | Attending: General Surgery | Admitting: General Surgery

## 2021-11-08 DIAGNOSIS — C50411 Malignant neoplasm of upper-outer quadrant of right female breast: Secondary | ICD-10-CM

## 2021-11-08 DIAGNOSIS — R928 Other abnormal and inconclusive findings on diagnostic imaging of breast: Secondary | ICD-10-CM

## 2021-11-08 DIAGNOSIS — R921 Mammographic calcification found on diagnostic imaging of breast: Secondary | ICD-10-CM

## 2021-12-02 ENCOUNTER — Other Ambulatory Visit: Payer: Self-pay | Admitting: Oncology

## 2021-12-02 DIAGNOSIS — C50411 Malignant neoplasm of upper-outer quadrant of right female breast: Secondary | ICD-10-CM

## 2022-01-10 ENCOUNTER — Ambulatory Visit: Payer: Medicare HMO | Admitting: Radiation Oncology

## 2022-01-26 ENCOUNTER — Encounter: Payer: Self-pay | Admitting: Radiation Oncology

## 2022-01-26 ENCOUNTER — Telehealth: Payer: Self-pay | Admitting: *Deleted

## 2022-01-26 ENCOUNTER — Ambulatory Visit
Admission: RE | Admit: 2022-01-26 | Discharge: 2022-01-26 | Disposition: A | Discharge status: Home / Self Care | Payer: Medicare HMO | Source: Ambulatory Visit | Attending: Radiation Oncology | Admitting: Radiation Oncology

## 2022-01-26 VITALS — BP 104/71 | HR 86 | Temp 98.0°F | Resp 16 | Ht 63.0 in | Wt 102.0 lb

## 2022-01-26 DIAGNOSIS — Z79811 Long term (current) use of aromatase inhibitors: Secondary | ICD-10-CM | POA: Diagnosis not present

## 2022-01-26 DIAGNOSIS — C50411 Malignant neoplasm of upper-outer quadrant of right female breast: Secondary | ICD-10-CM | POA: Diagnosis present

## 2022-01-26 DIAGNOSIS — Z923 Personal history of irradiation: Secondary | ICD-10-CM | POA: Insufficient documentation

## 2022-01-26 DIAGNOSIS — Z170 Estrogen receptor positive status [ER+]: Secondary | ICD-10-CM | POA: Insufficient documentation

## 2022-01-26 NOTE — Progress Notes (Signed)
Radiation Oncology Follow up Note  Name: Sarah Hurley   Date:   01/26/2022 MRN:  [id number] DOB: [birth year]    This 66 y.o. female presents to the clinic today for 1 year follow-up status post whole breast radiation to her right breast for invasive lobular carcinoma stage IIb (T2 N1 M0) ER/PR positive.  REFERRING PROVIDER: Maryland Pink, MD  HPI: Patient is a 66 year old female now out 1 year having completed whole breast radiation to her right breast status post wide local excision for invasive lobular carcinoma stage IIb.  Seen today in routine follow-up she is doing well.  She specifically denies breast tenderness cough or bone pain..  She had a mammogram back in October showing some increased indeterminate calcifications in the outer upper inner left breast.  She is discussing treatment options including possible biopsy with her surgeon.  She is currently on Femara.  COMPLICATIONS OF TREATMENT: none  FOLLOW UP COMPLIANCE: keeps appointments   PHYSICAL EXAM:  BP 104/71 (BP Location: Left Arm, Patient Position: Sitting, Cuff Size: Normal)   Pulse 86   Temp 98 F (36.7 C) (Tympanic)   Resp 16   Ht '5\' 3"'$  (1.6 m)   Wt 102 lb (46.3 kg)   BMI 18.07 kg/m  Patient has had reconstructed right breast.  No dominant masses noted in either breast.  No axillary or supraclavicular adenopathy is appreciated.  Well-developed well-nourished patient in NAD. HEENT reveals PERLA, EOMI, discs not visualized.  Oral cavity is clear. No oral mucosal lesions are identified. Neck is clear without evidence of cervical or supraclavicular adenopathy. Lungs are clear to A&P. Cardiac examination is essentially unremarkable with regular rate and rhythm without murmur rub or thrill. Abdomen is benign with no organomegaly or masses noted. Motor sensory and DTR levels are equal and symmetric in the upper and lower extremities. Cranial nerves II through XII are grossly intact. Proprioception is intact. No peripheral  adenopathy or edema is identified. No motor or sensory levels are noted. Crude visual fields are within normal range.  RADIOLOGY RESULTS: Mammograms reviewed compatible with above-stated findings  PLAN: At this time we will see the patient back in 1 year for follow-up.  I am going to contact the patient and make sure she is following up on her October mammogram and having discussions with her surgeon about possible ultrasound-guided biopsy.  Otherwise patient knows to call at anytime with any concerns.  I would like to take this opportunity to thank you for allowing me to participate in the care of your patient.Noreene Filbert, MD

## 2022-01-26 NOTE — Telephone Encounter (Signed)
Called patiet to remind her to follow up with surgeon regarding MM results. Patient returned call and stated that she had seen Dr. Bary Castilla after the MM for results and he was going to refer her to another surgeon.

## 2022-02-16 ENCOUNTER — Telehealth: Payer: Self-pay | Admitting: *Deleted

## 2022-02-16 NOTE — Telephone Encounter (Addendum)
Received on (02/14/22) via of fax DME Standard Written Order Waynetta Sandy) from [facility].  Requesting signature and return.  Given to provider to sign.   DME Standard Written Order signed and faxed back to [facility].  Confirmation received and copy scanned into the chart.//AB/CMA

## 2022-03-15 ENCOUNTER — Encounter: Payer: Self-pay | Admitting: Oncology

## 2022-03-15 ENCOUNTER — Inpatient Hospital Stay: Payer: Medicare HMO | Attending: Oncology | Admitting: Oncology

## 2022-03-15 DIAGNOSIS — C50411 Malignant neoplasm of upper-outer quadrant of right female breast: Secondary | ICD-10-CM | POA: Diagnosis not present

## 2022-03-15 MED ORDER — ANASTROZOLE 1 MG PO TABS: 1.0000 mg | ORAL_TABLET | Freq: Every day | ORAL | 3 refills | Status: DC

## 2022-03-15 NOTE — Progress Notes (Signed)
[place]  [phone]) [phone] [phone]) [phone]  ID: Sarah Hurley OB: 03/19/1955  MR#: [id number]  [id number]  Patient Care Team: Sarah Pink, MD as PCP - General (Family Medicine) Sarah Demark, RN (Inactive) as Oncology Nurse Navigator  I connected with Sarah Hurley on 03/15/22 at  2:45 PM EST by video enabled telemedicine visit and verified that I am speaking with the correct person using two identifiers.   I discussed the limitations, risks, security and privacy concerns of performing an evaluation and management service by telemedicine and the availability of in-person appointments. I also discussed with the patient that there may be a patient responsible charge related to this service. The patient expressed understanding and agreed to proceed.   Other persons participating in the visit and their role in the encounter: Patient, MD.  Patient's location: Car. Provider's location: Clinic.  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  Oncotype score 14, low risk.  INTERVAL HISTORY: Patient agreed to video assisted telemedicine visit for routine 73-monthevaluation.  She recently discontinued letrozole secondary to worsening hot flashes and her symptoms have resolved.  She currently feels well and is asymptomatic. She has no neurologic complaints.  She denies any recent fevers or illnesses.  She has a good appetite and denies weight loss.  She has no chest pain, shortness of breath, cough, or hemoptysis.  She denies any nausea, vomiting, constipation, or diarrhea.  She has no urinary complaints.  Patient offers no further specific complaints today.  REVIEW OF SYSTEMS:   Review of Systems  Constitutional: Negative.  Negative for fever, malaise/fatigue and weight loss.  Respiratory: Negative.  Negative for cough and hemoptysis.   Cardiovascular: Negative.  Negative for chest  pain and leg swelling.  Gastrointestinal: Negative.  Negative for abdominal pain.  Genitourinary: Negative.   Musculoskeletal: Negative.  Negative for back pain.  Skin: Negative.  Negative for rash.  Neurological: Negative.  Negative for dizziness, focal weakness, weakness and headaches.  Psychiatric/Behavioral: Negative.  The patient is not nervous/anxious.     As per HPI. Otherwise, a complete review of systems is negative.  PAST MEDICAL HISTORY: Past Medical History:  Diagnosis Date   Arthritis    fingers   Breast mass, right 12/20/2017   PLEOMORPHIC LOBULAR CARCINOMA IN SITU   [place] [facility]   Complication of anesthesia    PT STATES THAT ANESTHESIA HAS ALWAYS USE THE ANESTHESIA THAT IS GIVEN TO HEART PATIENTS   Fluttering heart    GERD (gastroesophageal reflux disease)    Heart murmur    History of methicillin resistant staphylococcus aureus (MRSA)    PONV (postoperative nausea and vomiting)    patient denies PONV 06/08/20   Seasonal allergies    Thyroid nodule     PAST SURGICAL HISTORY: Past Surgical History:  Procedure Laterality Date   AXILLARY LYMPH NODE DISSECTION Right 07/29/2020   Procedure: AXILLARY LYMPH NODE DISSECTION;  Surgeon: BRobert Bellow MD;  Location: [facility];  Service: General;  Laterality: Right;  (no SLN)   BREAST BIOPSY Right 12/20/2017   stereo bx calcs/x clip/ PLEOMORPHIC LOBULAR CARCINOMA IN SITU   BREAST BIOPSY Right 04/09/2020   u/s bx path pending 10:00 6cmfn Q clip   BREAST CYST ASPIRATION Bilateral 2003   neg   BREAST LUMPECTOMY Right 03/26/2018   LCIS   BREAST LUMPECTOMY WITH NEEDLE LOCALIZATION Right 03/26/2018   Procedure: RIGHT  BREAST WIDE EXCISION WITH NEEDLE LOCALIZATION;  Surgeon: Robert Bellow, MD;  Location: [facility];  Service: General;  Laterality: Right;   BREAST RECONSTRUCTION WITH PLACEMENT OF TISSUE EXPANDER AND FLEX HD (ACELLULAR HYDRATED DERMIS) Right 06/17/2020   Procedure: IMMEDIATE RIGHT  BREAST RECONSTRUCTION WITH PLACEMENT OF TISSUE EXPANDER AND FLEX HD (ACELLULAR HYDRATED DERMIS);  Surgeon: Wallace Going, DO;  Location: [facility];  Service: Plastics;  Laterality: Right;   COLONOSCOPY  2015   EYE SURGERY     BIL   mastecomy Right 06/17/2020   mastecomy with implant with radiation.   MASTOPEXY Left 09/30/2020   Procedure: LEFT BREAST MASTOPEXY;  Surgeon: Wallace Going, DO;  Location: [place];  Service: Plastics;  Laterality: Left;   REMOVAL OF TISSUE EXPANDER AND PLACEMENT OF IMPLANT Right 09/30/2020   Procedure: REMOVAL OF TISSUE EXPANDER AND PLACEMENT OF IMPLANT RIGHT BREAST;  Surgeon: Wallace Going, DO;  Location: [place];  Service: Plastics;  Laterality: Right;  2 hours   SIMPLE MASTECTOMY WITH AXILLARY SENTINEL NODE BIOPSY Right 06/17/2020   Procedure: SIMPLE MASTECTOMY WITH AXILLARY SENTINEL NODE BIOPSY;  Surgeon: Robert Bellow, MD;  Location: [facility];  Service: General;  Laterality: Right;   TONSILLECTOMY      FAMILY HISTORY: Family History  Problem Relation Age of Onset   Hypertension Mother    Heart disease Father    Breast cancer Sister     ADVANCED DIRECTIVES (Y/N):  N  HEALTH MAINTENANCE: Social History   Tobacco Use   Smoking status: Never   Smokeless tobacco: Never  Vaping Use   Vaping Use: Never used  Substance Use Topics   Alcohol use: Yes    Alcohol/week: 14.0 standard drinks of alcohol    Types: 14 Shots of liquor per week    Comment: couple of drinks/day   Drug use: No     Colonoscopy:  PAP:  Bone density:  Lipid panel:  Allergies  Allergen Reactions   Pseudoephedrine Other (See Comments)    palpitations    Current Outpatient Medications  Medication Sig Dispense Refill   anastrozole (ARIMIDEX) 1 MG tablet Take 1 tablet (1 mg total) by mouth daily. 90 tablet 3   letrozole (FEMARA) 2.5 MG tablet TAKE 1 TABLET BY MOUTH EVERY DAY 90 tablet 3   magnesium 30 MG tablet  Take 30 mg by mouth daily.     Olopatadine HCl (PATADAY OP) Place 1 drop into both eyes daily.     OVER THE COUNTER MEDICATION Take 1 tablet by mouth daily. Chelated zinc with copper     PAPAYA ENZYME PO Take 1 tablet by mouth daily as needed (indigestion).     triamcinolone (NASACORT) 55 MCG/ACT AERO nasal inhaler Place 1 spray into the nose daily.     vitamin C (ASCORBIC ACID) 250 MG tablet Take 250 mg by mouth daily.     XIIDRA 5 % SOLN      No current facility-administered medications for this visit.    OBJECTIVE: There were no vitals filed for this visit.     There is no height or weight on file to calculate BMI.    ECOG FS:0 - Asymptomatic  General: Well-developed, well-nourished, no acute distress. HEENT: Normocephalic. Neuro: Alert, answering all questions appropriately. Cranial nerves grossly intact. Psych: Normal affect.  LAB RESULTS:  Lab Results  Component Value Date   NA 140 06/04/2015   K 3.4 (L) 06/04/2015   CL 102 06/04/2015   CO2  25 06/04/2015   GLUCOSE 105 (H) 06/04/2015   BUN 18 06/04/2015   CREATININE 0.93 06/04/2015   CALCIUM 10.2 06/04/2015   PROT 7.7 06/04/2015   ALBUMIN 5.0 06/04/2015   AST 30 06/04/2015   ALT 18 06/04/2015   ALKPHOS 49 06/04/2015   BILITOT 2.3 (H) 06/04/2015   GFRNONAA >60 06/04/2015   GFRAA >60 06/04/2015    Lab Results  Component Value Date   WBC 5.3 11/30/2020   NEUTROABS 3.4 06/04/2015   HGB 16.0 (H) 11/30/2020   HCT 46.6 (H) 11/30/2020   MCV 94.7 11/30/2020   PLT 168 11/30/2020     STUDIES: No results found.   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  Oncotype DX score 14, low risk.   PLAN:    1. 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  She did not undergo any adjuvant treatment with letrozole or XRT at that  time.  Patient subsequently had local recurrence and underwent simple mastectomy on Jun 17, 2020 with 1 lymph node positive for disease at that time.  She then underwent axillary dissection on July 29, 2020 and noted to have 2 additional lymph nodes positive for disease for a total of 3.  Although she had full mastectomy, she underwent adjuvant XRT given her positive axillary lymph nodes completing treatment on December 07, 2020.  Adjuvant chemotherapy was not necessary given her low risk Oncotype score.  Patient has discontinued letrozole secondary to hot flashes.  She was given a prescription for anastrozole today. Continue treatment for minimum of 5 years completing in November 2027.  Given her node positive disease, can consider extending treatment for 7 to 10 years.  Patient's most recent mammogram on November 08, 2021 continued to be reported as BI-RADS 4.  Repeat in April 2024.  Return to clinic in 3 months with video-assisted telemedicine visit at which point patient can likely be transition back to evaluation every 6 months.  2.  Bone health: Patient underwent bone mineral density on May 13, 2020 that revealed a T score of -0.2 which is considered normal.  Repeat in April 2024 along with mammogram as above.  I provided 20 minutes of face-to-face video visit time during this encounter which included chart review, counseling, and coordination of care as documented above.     Patient expressed understanding and was in agreement with this plan. She also understands that She can call clinic at any time with any questions, concerns, or complaints.    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

## 2022-03-15 NOTE — Progress Notes (Signed)
Patient scheduled for virtual visit today for follow up regarding breast cancer. Patient discontinued letrozole about 1 week ago due to side effects. Patient reports she did not like how it made her feel overall. She would like to discuss other options.

## 2022-03-31 IMAGING — MG DIGITAL SCREENING UNILAT LEFT W/ TOMO W/ CAD
4 series · 4 of 12 positions shown · non-contrast
Comparison: Previous exam(s).

CLINICAL DATA: Screening.

EXAM:
DIGITAL SCREENING UNILATERAL LEFT MAMMOGRAM WITH CAD AND
TOMOSYNTHESIS
TECHNIQUE: Left screening digital craniocaudal and mediolateral oblique
mammograms were obtained. Left screening digital breast
tomosynthesis was performed. The images were evaluated with
computer-aided detection.

[L CC synth-2D]
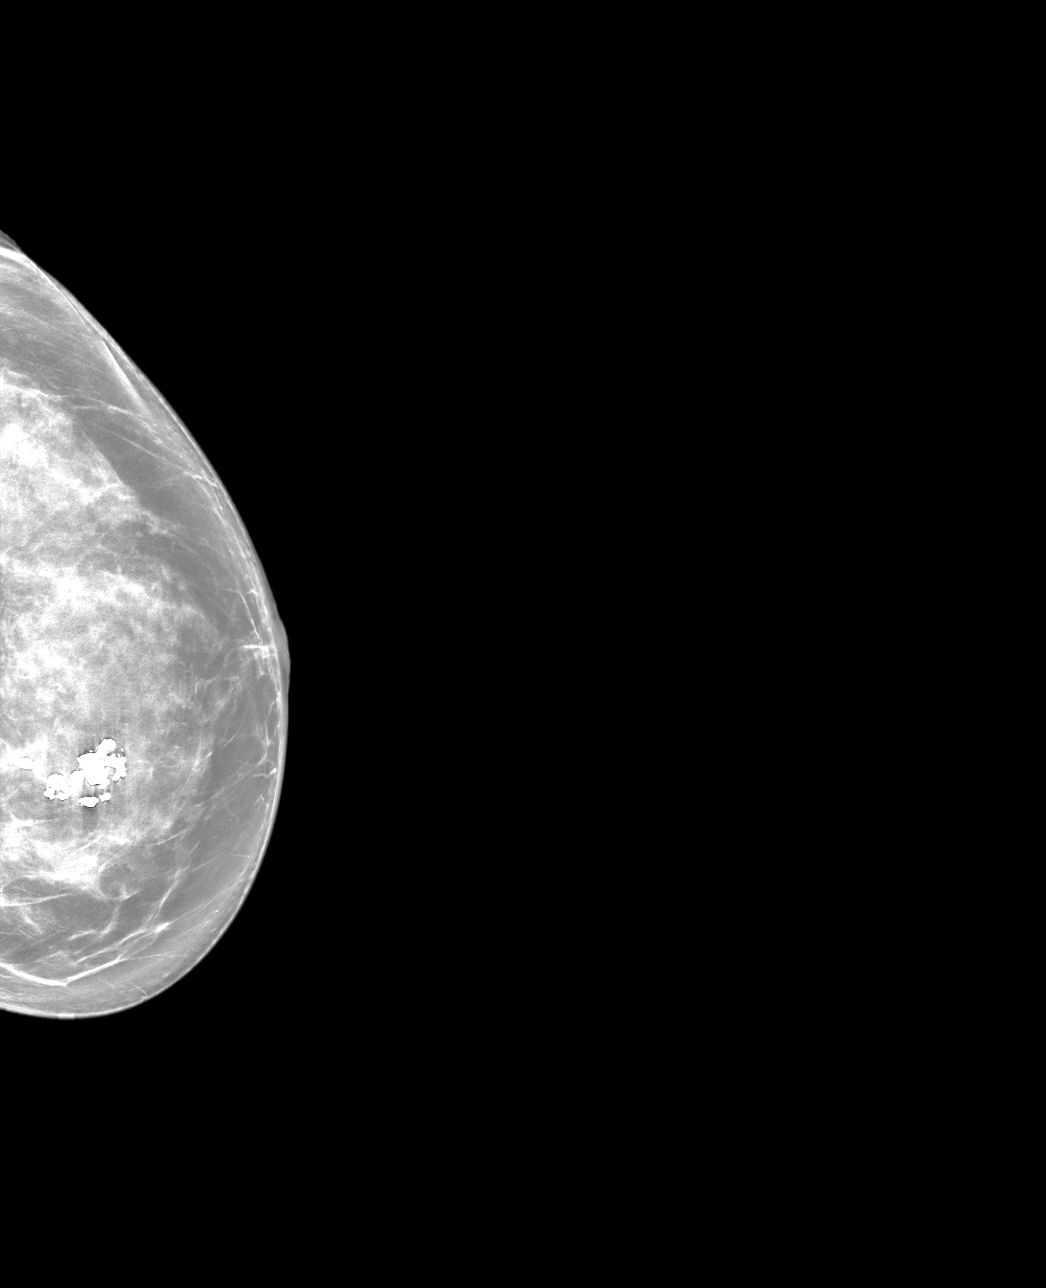

[L MLO synth-2D]
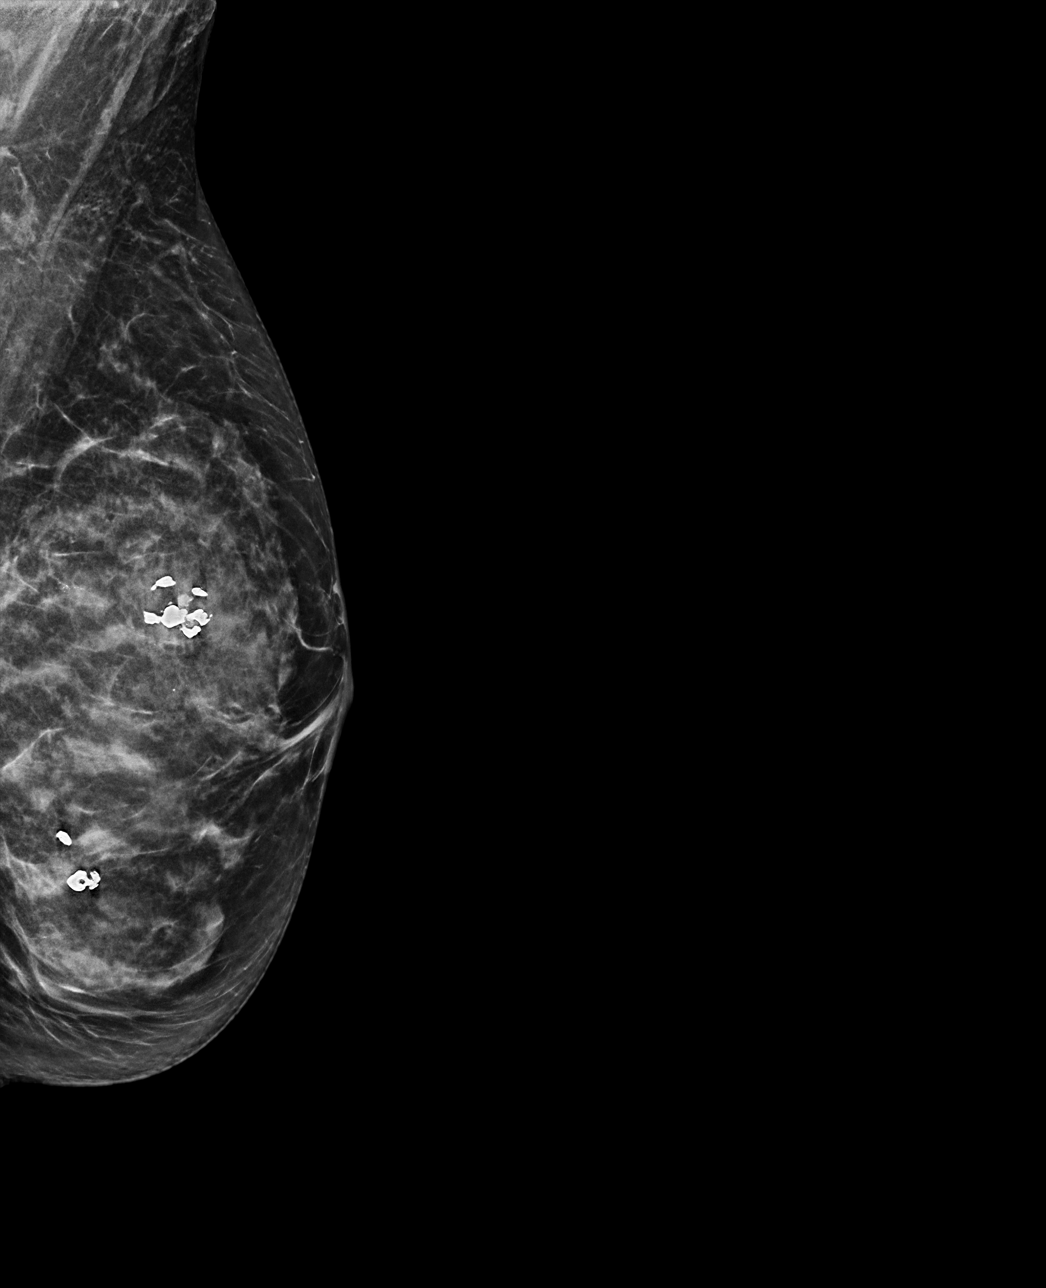

[L CC tomo · tomo slice 36/71.0]
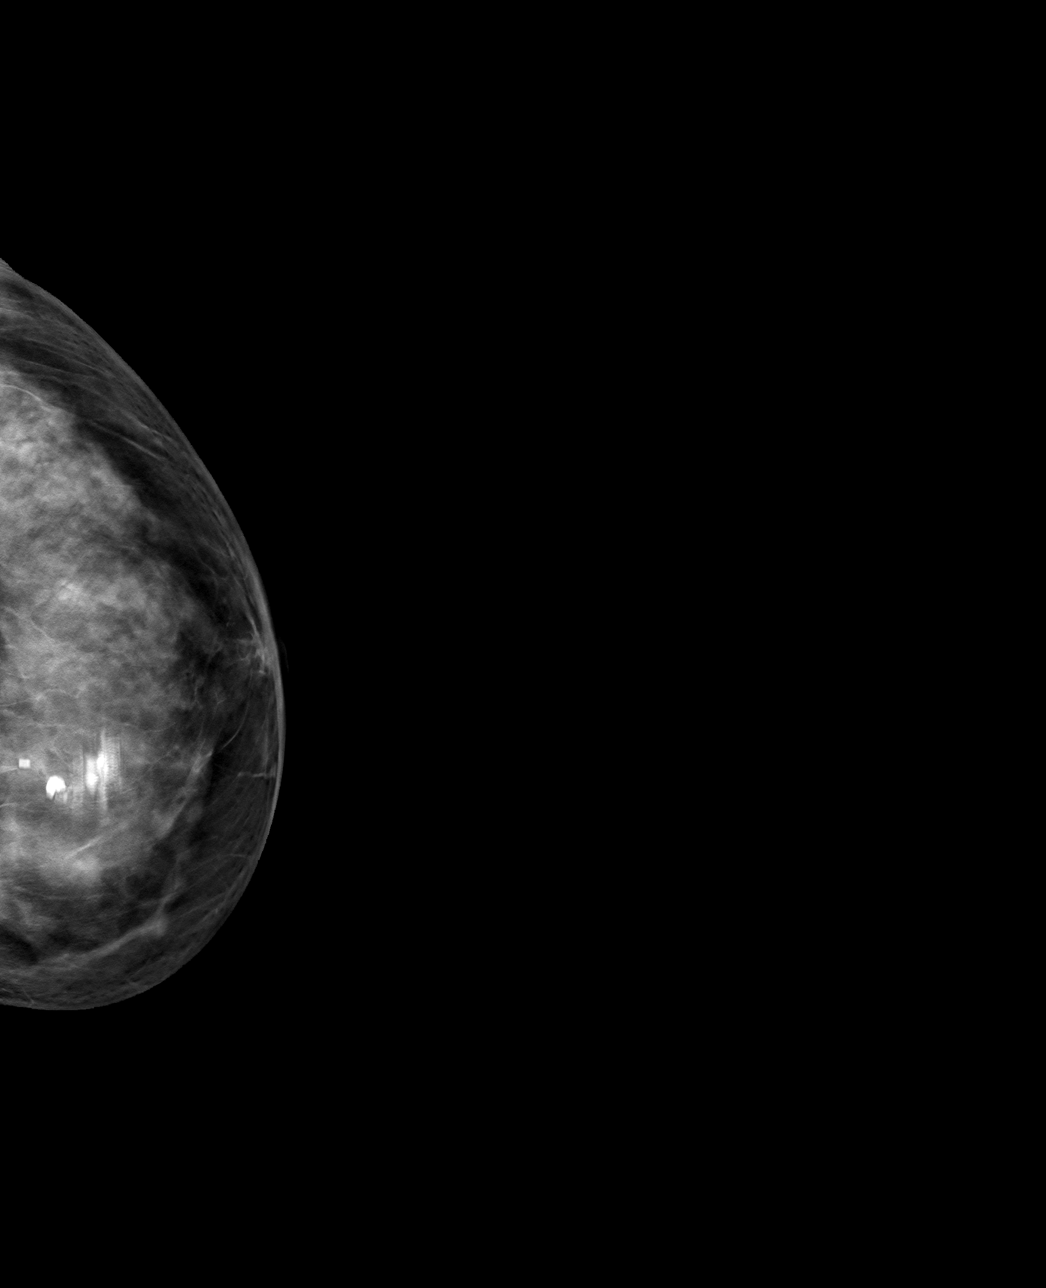

[L MLO tomo · tomo slice 28/55.0]
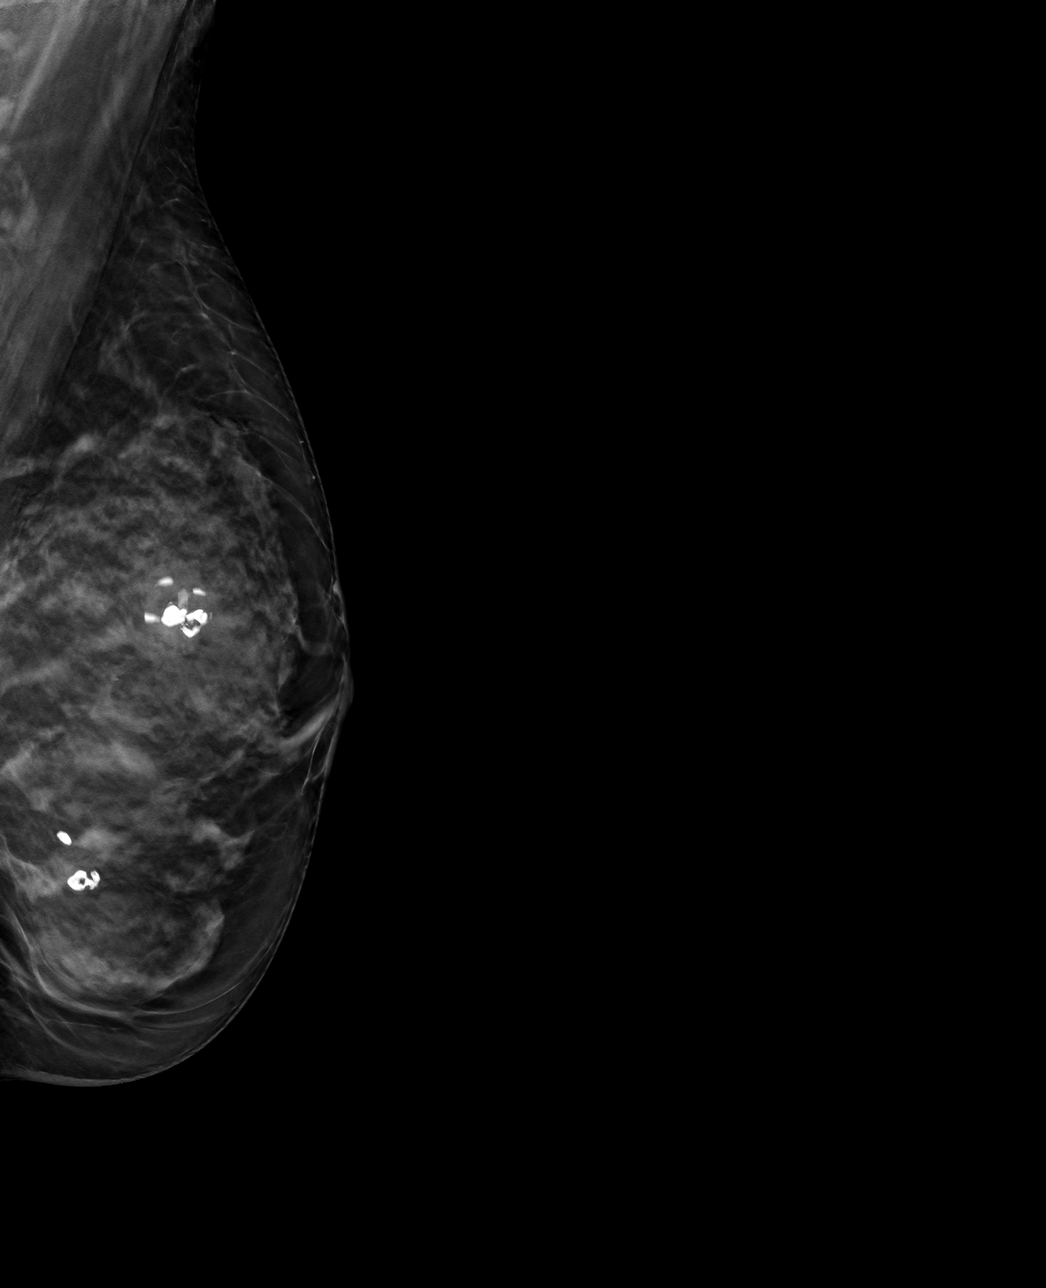

[4 of 12 positions shown; findings below may reference images not displayed]

ACR Breast Density Category d: The breast tissue is extremely dense,
which lowers the sensitivity of mammography.
FINDINGS: In the left breast, calcifications warrant further evaluation.
IMPRESSION: Further evaluation is suggested for calcifications in the left
breast.

RECOMMENDATION:
Diagnostic mammogram of the left breast. (Code:[id number])

The patient will be contacted regarding the findings, and additional
imaging will be scheduled.

BI-RADS CATEGORY  0: Incomplete. Need additional imaging evaluation
and/or prior mammograms for comparison.

## 2022-05-19 ENCOUNTER — Other Ambulatory Visit: Payer: Self-pay | Admitting: Oncology

## 2022-05-19 DIAGNOSIS — D751 Secondary polycythemia: Secondary | ICD-10-CM

## 2022-05-20 ENCOUNTER — Encounter: Payer: Self-pay | Admitting: Oncology

## 2022-05-20 ENCOUNTER — Inpatient Hospital Stay: Payer: Medicare HMO | Attending: Oncology | Admitting: Oncology

## 2022-05-20 ENCOUNTER — Inpatient Hospital Stay: Payer: Medicare HMO

## 2022-05-20 VITALS — BP 143/79 | HR 102 | Temp 96.9°F | Resp 16 | Ht 63.0 in | Wt 103.0 lb

## 2022-05-20 DIAGNOSIS — Z8614 Personal history of Methicillin resistant Staphylococcus aureus infection: Secondary | ICD-10-CM | POA: Diagnosis not present

## 2022-05-20 DIAGNOSIS — Z7289 Other problems related to lifestyle: Secondary | ICD-10-CM | POA: Diagnosis not present

## 2022-05-20 DIAGNOSIS — Z803 Family history of malignant neoplasm of breast: Secondary | ICD-10-CM | POA: Diagnosis not present

## 2022-05-20 DIAGNOSIS — D751 Secondary polycythemia: Secondary | ICD-10-CM | POA: Diagnosis not present

## 2022-05-20 DIAGNOSIS — Z79811 Long term (current) use of aromatase inhibitors: Secondary | ICD-10-CM | POA: Insufficient documentation

## 2022-05-20 DIAGNOSIS — C50411 Malignant neoplasm of upper-outer quadrant of right female breast: Secondary | ICD-10-CM | POA: Insufficient documentation

## 2022-05-20 DIAGNOSIS — Z170 Estrogen receptor positive status [ER+]: Secondary | ICD-10-CM | POA: Insufficient documentation

## 2022-05-20 DIAGNOSIS — Z8249 Family history of ischemic heart disease and other diseases of the circulatory system: Secondary | ICD-10-CM | POA: Diagnosis not present

## 2022-05-20 DIAGNOSIS — Z79899 Other long term (current) drug therapy: Secondary | ICD-10-CM | POA: Insufficient documentation

## 2022-05-20 LAB — CBC (CANCER CENTER ONLY)
HCT: 54.8 % — ABNORMAL HIGH (ref 36.0–46.0)
Hemoglobin: 18.9 g/dL — ABNORMAL HIGH (ref 12.0–15.0)
MCH: 33.1 pg (ref 26.0–34.0)
MCHC: 34.5 g/dL (ref 30.0–36.0)
MCV: 96 fL (ref 80.0–100.0)
Platelet Count: 204 10*3/uL (ref 150–400)
RBC: 5.71 MIL/uL — ABNORMAL HIGH (ref 3.87–5.11)
RDW: 13.2 % (ref 11.5–15.5)
WBC Count: 6.4 10*3/uL (ref 4.0–10.5)
nRBC: 0 % (ref 0.0–0.2)

## 2022-05-20 LAB — FERRITIN: Ferritin: 108 ng/mL (ref 11–307)

## 2022-05-20 LAB — IRON AND TIBC
Iron: 140 ug/dL (ref 28–170)
Saturation Ratios: 37 % — ABNORMAL HIGH (ref 10.4–31.8)
TIBC: 379 ug/dL (ref 250–450)
UIBC: 239 ug/dL

## 2022-05-20 NOTE — Progress Notes (Signed)
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  Oncotype score 14, low risk.  INTERVAL HISTORY: Patient returns to clinic as an add-on after noted to have polycythemia on routine blood work.  She currently feels well and is asymptomatic.  She has discontinued anastrozole.  She has no neurologic complaints.  She denies any recent fevers or illnesses.  She has a good appetite and denies weight loss.  She has no chest pain, shortness of breath, cough, or hemoptysis.  She denies any nausea, vomiting, constipation, or diarrhea.  She has no urinary complaints.  Patient offers no further specific complaints today.  REVIEW OF SYSTEMS:   Review of Systems  Constitutional: Negative.  Negative for fever, malaise/fatigue and weight loss.  Respiratory: Negative.  Negative for cough and hemoptysis.   Cardiovascular: Negative.  Negative for chest pain and leg swelling.  Gastrointestinal: Negative.  Negative for abdominal pain.  Genitourinary: Negative.   Musculoskeletal: Negative.  Negative for back pain.  Skin: Negative.  Negative for rash.  Neurological: Negative.  Negative for dizziness, focal weakness, weakness and headaches.  Psychiatric/Behavioral: Negative.  The patient is not nervous/anxious.     As per HPI. Otherwise, a complete review of systems is negative.  PAST MEDICAL HISTORY: Past Medical History:  Diagnosis Date   Arthritis    fingers   Breast mass, right 12/20/2017   PLEOMORPHIC LOBULAR CARCINOMA IN SITU   Cancer    right breast ILC   Complication of anesthesia    PT STATES THAT ANESTHESIA HAS ALWAYS USE THE ANESTHESIA THAT IS GIVEN  TO HEART PATIENTS   Fluttering heart    GERD (gastroesophageal reflux disease)    Heart murmur    History of methicillin resistant staphylococcus aureus (MRSA)    PONV (postoperative nausea and vomiting)    patient denies PONV 06/08/20   Seasonal allergies    Thyroid nodule     PAST SURGICAL HISTORY: Past Surgical History:  Procedure Laterality Date   AXILLARY LYMPH NODE DISSECTION Right 07/29/2020   Procedure: AXILLARY LYMPH NODE DISSECTION;  Surgeon: Earline Mayotte, MD;  Location: [facility];  Service: General;  Laterality: Right;  (no SLN)   BREAST BIOPSY Right 12/20/2017   stereo bx calcs/x clip/ PLEOMORPHIC LOBULAR CARCINOMA IN SITU   BREAST BIOPSY Right 04/09/2020   u/s bx path pending 10:00 6cmfn Q clip   BREAST CYST ASPIRATION Bilateral 2003   neg   BREAST LUMPECTOMY Right 03/26/2018   LCIS   BREAST LUMPECTOMY WITH NEEDLE LOCALIZATION Right 03/26/2018   Procedure: RIGHT BREAST WIDE EXCISION WITH NEEDLE LOCALIZATION;  Surgeon: Earline Mayotte, MD;  Location: [facility];  Service: General;  Laterality: Right;   BREAST RECONSTRUCTION WITH PLACEMENT OF TISSUE EXPANDER AND FLEX HD (ACELLULAR HYDRATED DERMIS) Right 06/17/2020   Procedure: IMMEDIATE RIGHT BREAST RECONSTRUCTION WITH PLACEMENT OF TISSUE EXPANDER AND FLEX HD (ACELLULAR HYDRATED DERMIS);  Surgeon: Peggye Form, DO;  Location: [facility];  Service: Plastics;  Laterality: Right;   COLONOSCOPY  2015   EYE SURGERY     BIL   mastecomy Right 06/17/2020   mastecomy with implant with radiation.   MASTOPEXY Left 09/30/2020   Procedure: LEFT BREAST MASTOPEXY;  Surgeon:  Dillingham, Alena Bills, DO;  Location: [facility];  Service: Plastics;  Laterality: Left;   REMOVAL OF TISSUE EXPANDER AND PLACEMENT OF IMPLANT Right 09/30/2020   Procedure: REMOVAL OF TISSUE EXPANDER AND PLACEMENT OF IMPLANT RIGHT BREAST;  Surgeon: Peggye Form, DO;  Location: [facility];  Service: Plastics;   Laterality: Right;  2 hours   SIMPLE MASTECTOMY WITH AXILLARY SENTINEL NODE BIOPSY Right 06/17/2020   Procedure: SIMPLE MASTECTOMY WITH AXILLARY SENTINEL NODE BIOPSY;  Surgeon: Earline Mayotte, MD;  Location: [facility];  Service: General;  Laterality: Right;   TONSILLECTOMY      FAMILY HISTORY: Family History  Problem Relation Age of Onset   Hypertension Mother    Heart disease Father    Breast cancer Sister     ADVANCED DIRECTIVES (Y/N):  N  HEALTH MAINTENANCE: Social History   Tobacco Use   Smoking status: Never   Smokeless tobacco: Never  Vaping Use   Vaping Use: Never used  Substance Use Topics   Alcohol use: Yes    Alcohol/week: 14.0 standard drinks of alcohol    Types: 14 Shots of liquor per week    Comment: couple of drinks/day   Drug use: No     Colonoscopy:  PAP:  Bone density:  Lipid panel:  Allergies  Allergen Reactions   Pseudoephedrine Other (See Comments)    palpitations    Current Outpatient Medications  Medication Sig Dispense Refill   magnesium 30 MG tablet Take 30 mg by mouth daily.     Olopatadine HCl (PATADAY OP) Place 1 drop into both eyes daily.     OVER THE COUNTER MEDICATION Take 1 tablet by mouth daily. Chelated zinc with copper     PAPAYA ENZYME PO Take 1 tablet by mouth daily as needed (indigestion).     triamcinolone (NASACORT) 55 MCG/ACT AERO nasal inhaler Place 1 spray into the nose daily.     XIIDRA 5 % SOLN      anastrozole (ARIMIDEX) 1 MG tablet Take 1 tablet (1 mg total) by mouth daily. (Patient not taking: Reported on 05/20/2022) 90 tablet 3   No current facility-administered medications for this visit.    OBJECTIVE: Vitals:   05/20/22 1045  BP: (!) 143/79  Pulse: (!) 102  Resp: 16  Temp: (!) 96.9 F (36.1 C)  SpO2: 100%       Body mass index is 18.25 kg/m.    ECOG FS:0 - Asymptomatic  General: Well-developed, well-nourished, no acute distress. Eyes: Pink conjunctiva, anicteric sclera. HEENT: Normocephalic,  moist mucous membranes. Lungs: No audible wheezing or coughing. Heart: Regular rate and rhythm. Abdomen: Soft, nontender, no obvious distention. Musculoskeletal: No edema, cyanosis, or clubbing. Neuro: Alert, answering all questions appropriately. Cranial nerves grossly intact. Skin: No rashes or petechiae noted. Psych: Normal affect.  LAB RESULTS:  Lab Results  Component Value Date   NA 140 06/04/2015   K 3.4 (L) 06/04/2015   CL 102 06/04/2015   CO2 25 06/04/2015   GLUCOSE 105 (H) 06/04/2015   BUN 18 06/04/2015   CREATININE 0.93 06/04/2015   CALCIUM 10.2 06/04/2015   PROT 7.7 06/04/2015   ALBUMIN 5.0 06/04/2015   AST 30 06/04/2015   ALT 18 06/04/2015   ALKPHOS 49 06/04/2015   BILITOT 2.3 (H) 06/04/2015   GFRNONAA >60 06/04/2015   GFRAA >60 06/04/2015    Lab Results  Component Value Date   WBC 6.4 05/20/2022   NEUTROABS 3.4 06/04/2015   HGB 18.9 (H)  05/20/2022   HCT 54.8 (H) 05/20/2022   MCV 96.0 05/20/2022   PLT 204 05/20/2022   Lab Results  Component Value Date   IRON 140 05/20/2022   TIBC 379 05/20/2022   IRONPCTSAT 37 (H) 05/20/2022   Lab Results  Component Value Date   FERRITIN 108 05/20/2022     STUDIES: No results found.   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  Oncotype DX score 14, low risk.   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  She did not undergo any adjuvant treatment with letrozole or XRT at that time.  Patient subsequently had local recurrence and underwent simple mastectomy on Jun 17, 2020 with 1 lymph node positive for disease at that time.  She then underwent axillary dissection on July 29, 2020 and noted to have 2 additional lymph nodes positive for disease for a total of 3.  Although she had full mastectomy, she underwent  adjuvant XRT given her positive axillary lymph nodes completing treatment on December 07, 2020.  Adjuvant chemotherapy was not necessary given her low risk Oncotype score.  Patient has now discontinued anastrozole and letrozole secondary to intolerance.  She does not wish to retry another medication and expressed understanding that this increases her risk of recurrence.  No further intervention is needed.  Patient's next mammogram is scheduled for Jun 01, 2022.  Bone health: Patient underwent bone mineral density on May 13, 2020 that revealed a T score of -0.2 which is considered normal.  Repeat in April 2024 along with mammogram as above. Polycythemia: Patient has significantly elevated hemoglobin of 18.9.  Iron stores are essentially within normal limits other than a mildly elevated iron saturation ratio.  Erythropoietin level and JAK2 mutation are pending at time of dictation.  Patient will return to clinic next week for 500 mL phlebotomy.  She would then return to clinic in 1 month with repeat laboratory work, further evaluation, and additional phlebotomy if needed.  I spent a total of 30 minutes reviewing chart data, face-to-face evaluation with the patient, counseling and coordination of care as detailed above.   Patient expressed understanding and was in agreement with this plan. She also understands that She can call clinic at any time with any questions, concerns, or complaints.    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

## 2022-05-22 LAB — ERYTHROPOIETIN: Erythropoietin: 4.1 m[IU]/mL (ref 2.6–18.5)

## 2022-05-23 ENCOUNTER — Inpatient Hospital Stay: Payer: Medicare HMO

## 2022-05-23 VITALS — BP 151/88 | HR 91 | Temp 97.8°F | Resp 20

## 2022-05-23 DIAGNOSIS — D751 Secondary polycythemia: Secondary | ICD-10-CM

## 2022-05-23 DIAGNOSIS — C50411 Malignant neoplasm of upper-outer quadrant of right female breast: Secondary | ICD-10-CM | POA: Diagnosis not present

## 2022-05-23 LAB — CARBON MONOXIDE, BLOOD (PERFORMED AT REF LAB): Carbon Monoxide, Blood: 2.4 % (ref 0.0–3.6)

## 2022-05-23 NOTE — Patient Instructions (Signed)

## 2022-05-30 ENCOUNTER — Other Ambulatory Visit: Payer: Medicare HMO

## 2022-05-31 LAB — CALR +MPL + E12-E15  (REFLEX)

## 2022-05-31 LAB — JAK2 V617F RFX CALR/MPL/E12-15

## 2022-06-01 ENCOUNTER — Ambulatory Visit
Admission: RE | Admit: 2022-06-01 | Discharge: 2022-06-01 | Disposition: A | Discharge status: Home / Self Care | Payer: Medicare HMO | Source: Ambulatory Visit | Attending: Oncology | Admitting: Oncology

## 2022-06-01 DIAGNOSIS — C50411 Malignant neoplasm of upper-outer quadrant of right female breast: Secondary | ICD-10-CM

## 2022-06-01 DIAGNOSIS — Z780 Asymptomatic menopausal state: Secondary | ICD-10-CM | POA: Diagnosis not present

## 2022-06-01 DIAGNOSIS — Z13820 Encounter for screening for osteoporosis: Secondary | ICD-10-CM | POA: Diagnosis not present

## 2022-06-02 ENCOUNTER — Encounter: Payer: Self-pay | Admitting: Oncology

## 2022-06-14 ENCOUNTER — Telehealth: Payer: Medicare HMO | Admitting: Oncology

## 2022-06-21 ENCOUNTER — Inpatient Hospital Stay: Payer: Medicare HMO | Attending: Oncology

## 2022-06-21 ENCOUNTER — Inpatient Hospital Stay (HOSPITAL_BASED_OUTPATIENT_CLINIC_OR_DEPARTMENT_OTHER): Payer: Medicare HMO | Admitting: Oncology

## 2022-06-21 ENCOUNTER — Inpatient Hospital Stay: Payer: Medicare HMO

## 2022-06-21 ENCOUNTER — Encounter: Payer: Self-pay | Admitting: Oncology

## 2022-06-21 VITALS — BP 127/70 | HR 82 | Temp 97.6°F | Resp 16 | Ht 63.0 in | Wt 106.0 lb

## 2022-06-21 DIAGNOSIS — Z803 Family history of malignant neoplasm of breast: Secondary | ICD-10-CM | POA: Insufficient documentation

## 2022-06-21 DIAGNOSIS — Z170 Estrogen receptor positive status [ER+]: Secondary | ICD-10-CM | POA: Diagnosis not present

## 2022-06-21 DIAGNOSIS — D751 Secondary polycythemia: Secondary | ICD-10-CM

## 2022-06-21 DIAGNOSIS — Z8249 Family history of ischemic heart disease and other diseases of the circulatory system: Secondary | ICD-10-CM | POA: Diagnosis not present

## 2022-06-21 DIAGNOSIS — Z79899 Other long term (current) drug therapy: Secondary | ICD-10-CM | POA: Insufficient documentation

## 2022-06-21 DIAGNOSIS — C50411 Malignant neoplasm of upper-outer quadrant of right female breast: Secondary | ICD-10-CM | POA: Insufficient documentation

## 2022-06-21 DIAGNOSIS — Z8614 Personal history of Methicillin resistant Staphylococcus aureus infection: Secondary | ICD-10-CM | POA: Diagnosis not present

## 2022-06-21 LAB — CBC WITH DIFFERENTIAL/PLATELET
Abs Immature Granulocytes: 0.01 10*3/uL (ref 0.00–0.07)
Basophils Absolute: 0 10*3/uL (ref 0.0–0.1)
Basophils Relative: 1 %
Eosinophils Absolute: 0.2 10*3/uL (ref 0.0–0.5)
Eosinophils Relative: 3 %
HCT: 45.7 % (ref 36.0–46.0)
Hemoglobin: 15.4 g/dL — ABNORMAL HIGH (ref 12.0–15.0)
Immature Granulocytes: 0 %
Lymphocytes Relative: 35 %
Lymphs Abs: 1.9 10*3/uL (ref 0.7–4.0)
MCH: 32.1 pg (ref 26.0–34.0)
MCHC: 33.7 g/dL (ref 30.0–36.0)
MCV: 95.2 fL (ref 80.0–100.0)
Monocytes Absolute: 0.4 10*3/uL (ref 0.1–1.0)
Monocytes Relative: 6 %
Neutro Abs: 3 10*3/uL (ref 1.7–7.7)
Neutrophils Relative %: 55 %
Platelets: 203 10*3/uL (ref 150–400)
RBC: 4.8 MIL/uL (ref 3.87–5.11)
RDW: 12.7 % (ref 11.5–15.5)
WBC: 5.4 10*3/uL (ref 4.0–10.5)
nRBC: 0 % (ref 0.0–0.2)

## 2022-06-21 LAB — IRON AND TIBC
Iron: 165 ug/dL (ref 28–170)
Saturation Ratios: 48 % — ABNORMAL HIGH (ref 10.4–31.8)
TIBC: 342 ug/dL (ref 250–450)
UIBC: 177 ug/dL

## 2022-06-21 LAB — FERRITIN: Ferritin: 148 ng/mL (ref 11–307)

## 2022-06-21 NOTE — Progress Notes (Signed)
Phlebotomy held per Dr. Orlie Dakin

## 2022-06-21 NOTE — Progress Notes (Unsigned)
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  Oncotype score 14, low risk.  INTERVAL HISTORY: Patient returns to clinic today for repeat laboratory work and routine evaluation.  She currently feels well and is asymptomatic.  She is no longer taking letrozole or anastrozole.  She has no neurologic complaints.  She denies any recent fevers or illnesses.  She has a good appetite and denies weight loss.  She has no chest pain, shortness of breath, cough, or hemoptysis.  She denies any nausea, vomiting, constipation, or diarrhea.  She has no urinary complaints.  Patient offers no specific complaints today.  REVIEW OF SYSTEMS:   Review of Systems  Constitutional: Negative.  Negative for fever, malaise/fatigue and weight loss.  Respiratory: Negative.  Negative for cough and hemoptysis.   Cardiovascular: Negative.  Negative for chest pain and leg swelling.  Gastrointestinal: Negative.  Negative for abdominal pain.  Genitourinary: Negative.   Musculoskeletal: Negative.  Negative for back pain.  Skin: Negative.  Negative for rash.  Neurological: Negative.  Negative for dizziness, focal weakness, weakness and headaches.  Psychiatric/Behavioral: Negative.  The patient is not nervous/anxious.     As per HPI. Otherwise, a complete review of systems is negative.  PAST MEDICAL HISTORY: Past Medical History:  Diagnosis Date   Arthritis    fingers   Breast mass, right 12/20/2017   PLEOMORPHIC LOBULAR CARCINOMA IN SITU   Cancer (HCC)    right breast ILC   Complication of anesthesia    PT STATES THAT ANESTHESIA HAS ALWAYS USE THE ANESTHESIA THAT IS  GIVEN TO HEART PATIENTS   Fluttering heart    GERD (gastroesophageal reflux disease)    Heart murmur    History of methicillin resistant staphylococcus aureus (MRSA)    PONV (postoperative nausea and vomiting)    patient denies PONV 06/08/20   Seasonal allergies    Thyroid nodule     PAST SURGICAL HISTORY: Past Surgical History:  Procedure Laterality Date   AXILLARY LYMPH NODE DISSECTION Right 07/29/2020   Procedure: AXILLARY LYMPH NODE DISSECTION;  Surgeon: Earline Mayotte, MD;  Location: [facility] ORS;  Service: General;  Laterality: Right;  (no SLN)   BREAST BIOPSY Right 12/20/2017   stereo bx calcs/x clip/ PLEOMORPHIC LOBULAR CARCINOMA IN SITU   BREAST BIOPSY Right 04/09/2020   u/s bx path pending 10:00 6cmfn Q clip   BREAST CYST ASPIRATION Bilateral 2003   neg   BREAST LUMPECTOMY Right 03/26/2018   LCIS   BREAST LUMPECTOMY WITH NEEDLE LOCALIZATION Right 03/26/2018   Procedure: RIGHT BREAST WIDE EXCISION WITH NEEDLE LOCALIZATION;  Surgeon: Earline Mayotte, MD;  Location: [facility];  Service: General;  Laterality: Right;   BREAST RECONSTRUCTION WITH PLACEMENT OF TISSUE EXPANDER AND FLEX HD (ACELLULAR HYDRATED DERMIS) Right 06/17/2020   Procedure: IMMEDIATE RIGHT BREAST RECONSTRUCTION WITH PLACEMENT OF TISSUE EXPANDER AND FLEX HD (ACELLULAR HYDRATED DERMIS);  Surgeon: Peggye Form, DO;  Location: [facility];  Service: Plastics;  Laterality: Right;   COLONOSCOPY  2015   EYE SURGERY     BIL   mastecomy Right 06/17/2020   mastecomy with implant with radiation.   MASTOPEXY Left 09/30/2020   Procedure: LEFT BREAST MASTOPEXY;  Surgeon:  Dillingham, Alena Bills, DO;  Location: [facility];  Service: Plastics;  Laterality: Left;   REMOVAL OF TISSUE EXPANDER AND PLACEMENT OF IMPLANT Right 09/30/2020   Procedure: REMOVAL OF TISSUE EXPANDER AND PLACEMENT OF IMPLANT RIGHT BREAST;  Surgeon: Peggye Form, DO;  Location: [facility];  Service: Plastics;   Laterality: Right;  2 hours   SIMPLE MASTECTOMY WITH AXILLARY SENTINEL NODE BIOPSY Right 06/17/2020   Procedure: SIMPLE MASTECTOMY WITH AXILLARY SENTINEL NODE BIOPSY;  Surgeon: Earline Mayotte, MD;  Location: [facility];  Service: General;  Laterality: Right;   TONSILLECTOMY      FAMILY HISTORY: Family History  Problem Relation Age of Onset   Hypertension Mother    Heart disease Father    Breast cancer Sister     ADVANCED DIRECTIVES (Y/N):  N  HEALTH MAINTENANCE: Social History   Tobacco Use   Smoking status: Never   Smokeless tobacco: Never  Vaping Use   Vaping Use: Never used  Substance Use Topics   Alcohol use: Yes    Alcohol/week: 14.0 standard drinks of alcohol    Types: 14 Shots of liquor per week    Comment: couple of drinks/day   Drug use: No     Colonoscopy:  PAP:  Bone density:  Lipid panel:  Allergies  Allergen Reactions   Pseudoephedrine Other (See Comments)    palpitations    Current Outpatient Medications  Medication Sig Dispense Refill   magnesium 30 MG tablet Take 30 mg by mouth daily.     Olopatadine HCl (PATADAY OP) Place 1 drop into both eyes daily.     OVER THE COUNTER MEDICATION Take 1 tablet by mouth daily. Chelated zinc with copper     PAPAYA ENZYME PO Take 1 tablet by mouth daily as needed (indigestion).     triamcinolone (NASACORT) 55 MCG/ACT AERO nasal inhaler Place 1 spray into the nose daily.     XIIDRA 5 % SOLN      No current facility-administered medications for this visit.    OBJECTIVE: Vitals:   06/21/22 1355  BP: 127/70  Pulse: 82  Resp: 16  Temp: 97.6 F (36.4 C)  SpO2: 100%       Body mass index is 18.78 kg/m.    ECOG FS:0 - Asymptomatic  General: Well-developed, well-nourished, no acute distress. Eyes: Pink conjunctiva, anicteric sclera. HEENT: Normocephalic, moist mucous membranes. Lungs: No audible wheezing or coughing. Heart: Regular rate and rhythm. Abdomen: Soft, nontender, no obvious  distention. Musculoskeletal: No edema, cyanosis, or clubbing. Neuro: Alert, answering all questions appropriately. Cranial nerves grossly intact. Skin: No rashes or petechiae noted. Psych: Normal affect.  LAB RESULTS:  Lab Results  Component Value Date   NA 140 06/04/2015   K 3.4 (L) 06/04/2015   CL 102 06/04/2015   CO2 25 06/04/2015   GLUCOSE 105 (H) 06/04/2015   BUN 18 06/04/2015   CREATININE 0.93 06/04/2015   CALCIUM 10.2 06/04/2015   PROT 7.7 06/04/2015   ALBUMIN 5.0 06/04/2015   AST 30 06/04/2015   ALT 18 06/04/2015   ALKPHOS 49 06/04/2015   BILITOT 2.3 (H) 06/04/2015   GFRNONAA >60 06/04/2015   GFRAA >60 06/04/2015    Lab Results  Component Value Date   WBC 5.4 06/21/2022   NEUTROABS 3.0 06/21/2022   HGB 15.4 (H) 06/21/2022   HCT 45.7 06/21/2022   MCV 95.2 06/21/2022   PLT 203 06/21/2022   Lab Results  Component Value Date   IRON 165  06/21/2022   TIBC 342 06/21/2022   IRONPCTSAT 48 (H) 06/21/2022   Lab Results  Component Value Date   FERRITIN 148 06/21/2022     STUDIES: DG Bone Density  Result Date: 06/01/2022 EXAM: DUAL X-RAY ABSORPTIOMETRY (DXA) FOR BONE MINERAL DENSITY IMPRESSION: Your patient Tyjuana Sotolongo completed a BMD test on 06/01/2022 using the [employer] iDXA DXA System (software version: 14.10) manufactured by [employer]. The following summarizes the results of our evaluation. Technologist: MTB PATIENT BIOGRAPHICAL: Name: Onna, Nahar Patient ID: [id number] Birth Date: 01-Aug-1955 Height: 63.0 in. Gender: Female Exam Date: 06/01/2022 Weight: 104.0 lbs. Indications: Caucasian, Postmenopausal, Breast CA Fractures: Left toe Treatments: DENSITOMETRY RESULTS: Site         Region     Measured Date Measured Age WHO Classification Young Adult T-score BMD         %Change vs. Previous Significant Change (*) AP Spine L1-L2 06/01/2022 66.5 Normal -0.3 1.136 g/cm2 -4.8% Yes AP Spine L1-L2 05/13/[age] 64.5 Normal 0.2 1.193 g/cm2 - - DualFemur Neck  Right 06/01/2022 66.5 Normal 0.0 1.035 g/cm2 -0.5% - DualFemur Neck Right 05/13/2020 64.5 Normal 0.0 1.040 g/cm2 - - DualFemur Total Mean 06/01/2022 66.5 Normal 0.2 1.033 g/cm2 0.9% - DualFemur Total Mean 05/13/2020 64.5 Normal 0.1 1.024 g/cm2 - - Left Forearm Radius 33% 06/01/2022 66.5 Normal -1.0 0.788 g/cm2 - - ASSESSMENT: The BMD measured at Forearm Radius 33% is 0.788 g/cm2 with a T-score of -1.0. This patient is considered normal according to World Health Organization [facility]) criteria. The scan quality is good. L-3 and L-4 were excluded due to degenerative changes. Compared with prior study, there has been significant decrease in the spine. Compared with prior study, there has been no significant change in the total hip. World Science writer [facility]) criteria for post-menopausal, Caucasian Women: Normal:                   T-score at or above -1 SD Osteopenia/low bone mass: T-score between -1 and -2.5 SD Osteoporosis:             T-score at or below -2.5 SD RECOMMENDATIONS: 1. All patients should optimize calcium and vitamin D intake. 2. Consider FDA-approved medical therapies in postmenopausal women and men aged 24 years and older, based on the following: a. A hip or vertebral(clinical or morphometric) fracture b. T-score < -2.5 at the femoral neck or spine after appropriate evaluation to exclude secondary causes c. Low bone mass (T-score between -1.0 and -2.5 at the femoral neck or spine) and a 10-year probability of a hip fracture > 3% or a 10-year probability of a major osteoporosis-related fracture > 20% based on the [place]-adapted WHO algorithm 3. Clinician judgment and/or patient preferences may indicate treatment for people with 10-year fracture probabilities above or below these levels FOLLOW-UP: People with diagnosed cases of osteoporosis or at high risk for fracture should have regular bone mineral density tests. For patients eligible for Medicare, routine testing is allowed once every 2 years. The  testing frequency can be increased to one year for patients who have rapidly progressing disease, those who are receiving or discontinuing medical therapy to restore bone mass, or have additional risk factors. I have reviewed this report, and agree with the above findings. [facility] Electronically Signed   By: Romona Curls M.D.   On: 06/01/2022 11:04   MM DIAG BREAST TOMO UNI LEFT  Result Date: 06/01/2022 CLINICAL DATA:  Follow-up for LEFT breast calcifications. These calcifications were initially identified  on screening mammogram of 04/08/2021. The subsequent diagnostic mammogram report of 05/06/2021 described these calcifications as indeterminate with recommendation for stereotactic-guided biopsy. Patient has declined biopsy. History of RIGHT mastectomy for invasive lobular carcinoma. EXAM: DIGITAL DIAGNOSTIC UNILATERAL LEFT MAMMOGRAM WITH TOMOSYNTHESIS TECHNIQUE: Left digital diagnostic mammography and breast tomosynthesis was performed. COMPARISON:  Previous exam(s). ACR Breast Density Category d: The breasts are extremely dense, which lowers the sensitivity of mammography. FINDINGS: The grouped punctate calcifications within the upper LEFT breast, at posterior depth, are stable compared to the diagnostic mammogram of 05/06/2021. There are no pleomorphic or fine linear branching calcifications identified. There are no new dominant masses, suspicious calcifications or secondary signs of malignancy identified elsewhere in the LEFT breast. IMPRESSION: Stable groups of punctate calcifications within the upper LEFT breast at posterior depth. These remain indeterminate with recommendation for stereotactic-guided biopsy. However, patient declines biopsy in favor of surveillance. RECOMMENDATION: LEFT breast diagnostic mammogram in 6 months as patient favors surveillance in lieu of biopsy. If patient continues to favor surveillance in lieu of biopsy, recommend follow-up diagnostic exams until 2 year  stability of the LEFT breast calcifications is demonstrated. I have discussed the findings and recommendations with the patient. If applicable, a reminder letter will be sent to the patient regarding the next appointment. BI-RADS CATEGORY  4: Suspicious. Electronically Signed   By: Bary Richard M.D.   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  Oncotype DX score 14, low risk.   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  She did not undergo any adjuvant treatment with letrozole or XRT at that time.  Patient subsequently had local recurrence and underwent simple mastectomy on Jun 17, 2020 with 1 lymph node positive for disease at that time.  She then underwent axillary dissection on July 29, 2020 and noted to have 2 additional lymph nodes positive for disease for a total of 3.  Although she had full mastectomy, she underwent adjuvant XRT given her positive axillary lymph nodes completing treatment on December 07, 2020.  Adjuvant chemotherapy was not necessary given her low risk Oncotype score.  Patient has now discontinued anastrozole and letrozole secondary to intolerance.  She does not wish to retry another medication and expressed understanding that this increases her risk of recurrence.  No further intervention is needed.  Her most recent mammogram on Jun 01, 2022 was reported as BI-RADS 4.  Biopsy was recommended, but patient declined.  Repeat mammogram in November 2024. Bone health: Patient's most recent bone mineral density on Jun 01, 2022 revealed a T-score of -1.0.  This is decreased from 2 years prior where her T-score is -0.2.  This is still considered normal.  Patient's next bone mineral density will be due in May 2026.  These can be monitored by primary care.   Polycythemia: Patient's hemoglobin has trended down to 15.4.  She has a persistent mildly elevated saturation ratio of 48%, other laboratory work including JAK2 mutation are either negative or within normal limits.  She does not require additional phlebotomy today.  Will order hemochromatosis mutation with next lab draw.  Return to clinic in 3 months for laboratory work only and then in 6 months for laboratory work and further evaluation.     Patient expressed understanding and was in agreement with this  plan. She also understands that She can call clinic at any time with any questions, concerns, or complaints.    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

## 2022-06-22 ENCOUNTER — Encounter: Payer: Self-pay | Admitting: Oncology

## 2022-09-21 ENCOUNTER — Inpatient Hospital Stay: Payer: Medicare HMO | Attending: Oncology

## 2022-09-21 DIAGNOSIS — C50411 Malignant neoplasm of upper-outer quadrant of right female breast: Secondary | ICD-10-CM | POA: Insufficient documentation

## 2022-09-21 DIAGNOSIS — Z79899 Other long term (current) drug therapy: Secondary | ICD-10-CM | POA: Insufficient documentation

## 2022-09-21 DIAGNOSIS — D751 Secondary polycythemia: Secondary | ICD-10-CM | POA: Diagnosis not present

## 2022-09-21 DIAGNOSIS — Z170 Estrogen receptor positive status [ER+]: Secondary | ICD-10-CM | POA: Diagnosis not present

## 2022-09-21 LAB — CBC WITH DIFFERENTIAL/PLATELET
Abs Immature Granulocytes: 0 10*3/uL (ref 0.00–0.07)
Basophils Absolute: 0.1 10*3/uL (ref 0.0–0.1)
Basophils Relative: 1 %
Eosinophils Absolute: 0.1 10*3/uL (ref 0.0–0.5)
Eosinophils Relative: 2 %
HCT: 54.5 % — ABNORMAL HIGH (ref 36.0–46.0)
Hemoglobin: 18.3 g/dL — ABNORMAL HIGH (ref 12.0–15.0)
Immature Granulocytes: 0 %
Lymphocytes Relative: 34 %
Lymphs Abs: 1.6 10*3/uL (ref 0.7–4.0)
MCH: 32.5 pg (ref 26.0–34.0)
MCHC: 33.6 g/dL (ref 30.0–36.0)
MCV: 96.8 fL (ref 80.0–100.0)
Monocytes Absolute: 0.4 10*3/uL (ref 0.1–1.0)
Monocytes Relative: 8 %
Neutro Abs: 2.5 10*3/uL (ref 1.7–7.7)
Neutrophils Relative %: 55 %
Platelets: 189 10*3/uL (ref 150–400)
RBC: 5.63 MIL/uL — ABNORMAL HIGH (ref 3.87–5.11)
RDW: 14.5 % (ref 11.5–15.5)
WBC: 4.5 10*3/uL (ref 4.0–10.5)
nRBC: 0 % (ref 0.0–0.2)

## 2022-09-21 LAB — FERRITIN: Ferritin: 19 ng/mL (ref 11–307)

## 2022-09-21 LAB — IRON AND TIBC
Iron: 86 ug/dL (ref 28–170)
Saturation Ratios: 19 % (ref 10.4–31.8)
TIBC: 445 ug/dL (ref 250–450)
UIBC: 359 ug/dL

## 2022-09-28 LAB — HEMOCHROMATOSIS DNA-PCR(C282Y,H63D)

## 2022-11-17 ENCOUNTER — Other Ambulatory Visit: Payer: Self-pay | Admitting: General Surgery

## 2022-11-17 DIAGNOSIS — Z853 Personal history of malignant neoplasm of breast: Secondary | ICD-10-CM

## 2022-12-07 ENCOUNTER — Ambulatory Visit
Admission: RE | Admit: 2022-12-07 | Discharge: 2022-12-07 | Disposition: A | Discharge status: Home / Self Care | Payer: Medicare HMO | Source: Ambulatory Visit | Attending: General Surgery | Admitting: General Surgery

## 2022-12-07 DIAGNOSIS — Z853 Personal history of malignant neoplasm of breast: Secondary | ICD-10-CM | POA: Diagnosis present

## 2022-12-08 ENCOUNTER — Encounter: Payer: Self-pay | Admitting: General Surgery

## 2022-12-08 DIAGNOSIS — R928 Other abnormal and inconclusive findings on diagnostic imaging of breast: Secondary | ICD-10-CM

## 2022-12-21 ENCOUNTER — Other Ambulatory Visit: Payer: Self-pay | Admitting: *Deleted

## 2022-12-21 DIAGNOSIS — D751 Secondary polycythemia: Secondary | ICD-10-CM

## 2022-12-22 ENCOUNTER — Inpatient Hospital Stay: Payer: Medicare HMO | Attending: Oncology

## 2022-12-22 ENCOUNTER — Encounter: Payer: Self-pay | Admitting: Oncology

## 2022-12-22 ENCOUNTER — Inpatient Hospital Stay: Payer: Medicare HMO | Admitting: Oncology

## 2022-12-22 VITALS — BP 137/80 | HR 83 | Temp 96.9°F | Resp 16 | Ht 63.0 in | Wt 107.7 lb

## 2022-12-22 DIAGNOSIS — I2510 Atherosclerotic heart disease of native coronary artery without angina pectoris: Secondary | ICD-10-CM | POA: Insufficient documentation

## 2022-12-22 DIAGNOSIS — Z9089 Acquired absence of other organs: Secondary | ICD-10-CM | POA: Insufficient documentation

## 2022-12-22 DIAGNOSIS — Z8614 Personal history of Methicillin resistant Staphylococcus aureus infection: Secondary | ICD-10-CM | POA: Insufficient documentation

## 2022-12-22 DIAGNOSIS — C50411 Malignant neoplasm of upper-outer quadrant of right female breast: Secondary | ICD-10-CM

## 2022-12-22 DIAGNOSIS — Z1721 Progesterone receptor positive status: Secondary | ICD-10-CM | POA: Insufficient documentation

## 2022-12-22 DIAGNOSIS — K219 Gastro-esophageal reflux disease without esophagitis: Secondary | ICD-10-CM | POA: Insufficient documentation

## 2022-12-22 DIAGNOSIS — Z170 Estrogen receptor positive status [ER+]: Secondary | ICD-10-CM | POA: Insufficient documentation

## 2022-12-22 DIAGNOSIS — D751 Secondary polycythemia: Secondary | ICD-10-CM | POA: Insufficient documentation

## 2022-12-22 DIAGNOSIS — Z803 Family history of malignant neoplasm of breast: Secondary | ICD-10-CM | POA: Insufficient documentation

## 2022-12-22 DIAGNOSIS — Z79899 Other long term (current) drug therapy: Secondary | ICD-10-CM | POA: Diagnosis not present

## 2022-12-22 DIAGNOSIS — Z8249 Family history of ischemic heart disease and other diseases of the circulatory system: Secondary | ICD-10-CM | POA: Insufficient documentation

## 2022-12-22 LAB — CBC WITH DIFFERENTIAL/PLATELET
Abs Immature Granulocytes: 0.01 10*3/uL (ref 0.00–0.07)
Basophils Absolute: 0.1 10*3/uL (ref 0.0–0.1)
Basophils Relative: 1 %
Eosinophils Absolute: 0.1 10*3/uL (ref 0.0–0.5)
Eosinophils Relative: 2 %
HCT: 51.3 % — ABNORMAL HIGH (ref 36.0–46.0)
Hemoglobin: 17.2 g/dL — ABNORMAL HIGH (ref 12.0–15.0)
Immature Granulocytes: 0 %
Lymphocytes Relative: 34 %
Lymphs Abs: 1.8 10*3/uL (ref 0.7–4.0)
MCH: 31.7 pg (ref 26.0–34.0)
MCHC: 33.5 g/dL (ref 30.0–36.0)
MCV: 94.5 fL (ref 80.0–100.0)
Monocytes Absolute: 0.4 10*3/uL (ref 0.1–1.0)
Monocytes Relative: 7 %
Neutro Abs: 3 10*3/uL (ref 1.7–7.7)
Neutrophils Relative %: 56 %
Platelets: 196 10*3/uL (ref 150–400)
RBC: 5.43 MIL/uL — ABNORMAL HIGH (ref 3.87–5.11)
RDW: 13.8 % (ref 11.5–15.5)
WBC: 5.3 10*3/uL (ref 4.0–10.5)
nRBC: 0 % (ref 0.0–0.2)

## 2022-12-22 LAB — IRON AND TIBC
Iron: 97 ug/dL (ref 28–170)
Saturation Ratios: 26 % (ref 10.4–31.8)
TIBC: 375 ug/dL (ref 250–450)
UIBC: 278 ug/dL

## 2022-12-22 LAB — FERRITIN: Ferritin: 57 ng/mL (ref 11–307)

## 2022-12-22 NOTE — Progress Notes (Signed)
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  Oncotype score 14, low risk.  INTERVAL HISTORY: Patient returns to clinic today for discussion of her mammogram results and routine 17-month evaluation.  She continues to feel well and remains asymptomatic.  She has no neurologic complaints.  She denies any recent fevers or illnesses.  She has a good appetite and denies weight loss.  She has no chest pain, shortness of breath, cough, or hemoptysis.  She denies any nausea, vomiting, constipation, or diarrhea.  She has no urinary complaints.  Patient offers no specific complaints today.  REVIEW OF SYSTEMS:   Review of Systems  Constitutional: Negative.  Negative for fever, malaise/fatigue and weight loss.  Respiratory: Negative.  Negative for cough and hemoptysis.   Cardiovascular: Negative.  Negative for chest pain and leg swelling.  Gastrointestinal: Negative.  Negative for abdominal pain.  Genitourinary: Negative.   Musculoskeletal: Negative.  Negative for back pain.  Skin: Negative.  Negative for rash.  Neurological: Negative.  Negative for dizziness, focal weakness, weakness and headaches.  Psychiatric/Behavioral: Negative.  The patient is not nervous/anxious.     As per HPI. Otherwise, a complete review of systems is negative.  PAST MEDICAL HISTORY: Past Medical History:  Diagnosis Date   Arthritis    fingers   Breast mass, right 12/20/2017   PLEOMORPHIC LOBULAR CARCINOMA IN SITU   Cancer (HCC)    right breast ILC   Complication of anesthesia    PT STATES THAT ANESTHESIA HAS ALWAYS USE THE ANESTHESIA THAT IS GIVEN TO HEART PATIENTS    Fluttering heart    GERD (gastroesophageal reflux disease)    Heart murmur    History of methicillin resistant staphylococcus aureus (MRSA)    PONV (postoperative nausea and vomiting)    patient denies PONV 06/08/20   Seasonal allergies    Thyroid nodule     PAST SURGICAL HISTORY: Past Surgical History:  Procedure Laterality Date   AXILLARY LYMPH NODE DISSECTION Right 07/29/2020   Procedure: AXILLARY LYMPH NODE DISSECTION;  Surgeon: Earline Mayotte, MD;  Location: [facility] ORS;  Service: General;  Laterality: Right;  (no SLN)   BREAST BIOPSY Right 12/20/2017   stereo bx calcs/x clip/ PLEOMORPHIC LOBULAR CARCINOMA IN SITU   BREAST BIOPSY Right 04/09/2020   u/s bx path pending 10:00 6cmfn Q clip   BREAST CYST ASPIRATION Bilateral 2003   neg   BREAST LUMPECTOMY Right 03/26/2018   LCIS   BREAST LUMPECTOMY WITH NEEDLE LOCALIZATION Right 03/26/2018   Procedure: RIGHT BREAST WIDE EXCISION WITH NEEDLE LOCALIZATION;  Surgeon: Earline Mayotte, MD;  Location: [facility];  Service: General;  Laterality: Right;   BREAST RECONSTRUCTION WITH PLACEMENT OF TISSUE EXPANDER AND FLEX HD (ACELLULAR HYDRATED DERMIS) Right 06/17/2020   Procedure: IMMEDIATE RIGHT BREAST RECONSTRUCTION WITH PLACEMENT OF TISSUE EXPANDER AND FLEX HD (ACELLULAR HYDRATED DERMIS);  Surgeon: Peggye Form, DO;  Location: [facility];  Service: Plastics;  Laterality: Right;   COLONOSCOPY  2015   EYE SURGERY     BIL   mastecomy Right 06/17/2020   mastecomy with implant with radiation.   MASTOPEXY Left 09/30/2020   Procedure: LEFT BREAST MASTOPEXY;  Surgeon: Peggye Form, DO;  Location: [facility];  Service: Plastics;  Laterality: Left;   REMOVAL OF TISSUE EXPANDER AND PLACEMENT OF IMPLANT Right 09/30/2020   Procedure: REMOVAL OF TISSUE EXPANDER AND PLACEMENT OF IMPLANT RIGHT BREAST;  Surgeon: Peggye Form, DO;  Location: [facility];  Service: Plastics;  Laterality: Right;  2 hours    SIMPLE MASTECTOMY WITH AXILLARY SENTINEL NODE BIOPSY Right 06/17/2020   Procedure: SIMPLE MASTECTOMY WITH AXILLARY SENTINEL NODE BIOPSY;  Surgeon: Earline Mayotte, MD;  Location: [facility];  Service: General;  Laterality: Right;   TONSILLECTOMY      FAMILY HISTORY: Family History  Problem Relation Age of Onset   Hypertension Mother    Heart disease Father    Breast cancer Sister     ADVANCED DIRECTIVES (Y/N):  N  HEALTH MAINTENANCE: Social History   Tobacco Use   Smoking status: Never   Smokeless tobacco: Never  Vaping Use   Vaping status: Never Used  Substance Use Topics   Alcohol use: Yes    Alcohol/week: 14.0 standard drinks of alcohol    Types: 14 Shots of liquor per week    Comment: couple of drinks/day   Drug use: No     Colonoscopy:  PAP:  Bone density:  Lipid panel:  Allergies  Allergen Reactions   Pseudoephedrine Other (See Comments)    palpitations    Current Outpatient Medications  Medication Sig Dispense Refill   magnesium 30 MG tablet Take 30 mg by mouth daily.     Magnesium Gluconate 550 MG TABS Take 30 mg by mouth.     Olopatadine HCl (PATADAY OP) Place 1 drop into both eyes daily.     OVER THE COUNTER MEDICATION Take 1 tablet by mouth daily. Chelated zinc with copper     PAPAYA ENZYME PO Take 1 tablet by mouth daily as needed (indigestion).     triamcinolone (NASACORT) 55 MCG/ACT AERO nasal inhaler Place 1 spray into the nose daily.     XIIDRA 5 % SOLN      No current facility-administered medications for this visit.    OBJECTIVE: Vitals:   12/22/22 1359  BP: 137/80  Pulse: 83  Resp: 16  Temp: (!) 96.9 F (36.1 C)  SpO2: 100%       Body mass index is 19.08 kg/m.    ECOG FS:0 - Asymptomatic  General: Well-developed, well-nourished, no acute distress. Eyes: Pink conjunctiva, anicteric sclera. HEENT: Normocephalic, moist mucous membranes. Breast: Exam deferred today. Lungs: No audible wheezing or coughing. Heart: Regular rate  and rhythm. Abdomen: Soft, nontender, no obvious distention. Musculoskeletal: No edema, cyanosis, or clubbing. Neuro: Alert, answering all questions appropriately. Cranial nerves grossly intact. Skin: No rashes or petechiae noted. Psych: Normal affect.  LAB RESULTS:  Lab Results  Component Value Date   NA 140 06/04/2015   K 3.4 (L) 06/04/2015   CL 102 06/04/2015   CO2 25 06/04/2015   GLUCOSE 105 (H) 06/04/2015   BUN 18 06/04/2015   CREATININE 0.93 06/04/2015   CALCIUM 10.2 06/04/2015   PROT 7.7 06/04/2015   ALBUMIN 5.0 06/04/2015   AST 30 06/04/2015   ALT 18 06/04/2015   ALKPHOS 49 06/04/2015   BILITOT 2.3 (H) 06/04/2015   GFRNONAA >60 06/04/2015   GFRAA >60 06/04/2015    Lab Results  Component Value Date   WBC 5.3 12/22/2022   NEUTROABS 3.0 12/22/2022   HGB 17.2 (H) 12/22/2022   HCT 51.3 (H) 12/22/2022   MCV 94.5 12/22/2022  PLT 196 12/22/2022   Lab Results  Component Value Date   IRON 86 09/21/2022   TIBC 445 09/21/2022   IRONPCTSAT 19 09/21/2022   Lab Results  Component Value Date   FERRITIN 19 09/21/2022     STUDIES: MM 3D DIAGNOSTIC MAMMOGRAM UNILATERAL LEFT BREAST  Result Date: 12/07/2022 CLINICAL DATA:  Follow-up of LEFT breast calcifications. Patient has declined biopsy. History of RIGHT breast mastectomy for invasive lobular carcinoma with multiple positive axillary lymph nodes. History of a LEFT breast reduction. EXAM: DIGITAL DIAGNOSTIC UNILATERAL LEFT MAMMOGRAM WITH TOMOSYNTHESIS AND CAD TECHNIQUE: Left digital diagnostic mammography and breast tomosynthesis was performed. The images were evaluated with computer-aided detection. COMPARISON:  Previous exam(s). ACR Breast Density Category d: The breasts are extremely dense, which lowers the sensitivity of mammography. FINDINGS: Spot magnification views of the LEFT breast demonstrated a relatively similar mammographic appearance of amorphous calcifications in the LEFT upper breast at posterior depth.  Benign dystrophic calcifications are noted in the LEFT breast. No new suspicious findings are noted. Stable postsurgical changes are noted. IMPRESSION: 1. Relatively stable mammographic appearance of indeterminate LEFT breast calcifications. They have not demonstrated progressive coarsening which would be expected of dystrophic calcifications. LEFT breast stereotactic guided biopsy is again recommended. Patient defers biopsy at this point in time. If biopsy is not pursued, then recommend follow-up diagnostic mammogram in 1 year. This will establish over 2 years of observation. Supplemental screening with breast MRI is recommended given breast density and history of invasive lobular carcinoma with metastatic lymph nodes. RECOMMENDATION: LEFT breast stereotactic guided biopsy x1 If biopsy is not pursued, then recommend follow-up diagnostic mammogram in 1 year. This will establish over 2 years of observation. Supplemental screening with breast MRI is recommended given breast density and history of invasive lobular carcinoma with metastatic lymph nodes. I have discussed the findings and recommendations with the patient. If applicable, a reminder letter will be sent to the patient regarding the next appointment. BI-RADS CATEGORY  4: Suspicious. Electronically Signed   By: Meda Klinefelter M.D.   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  Oncotype DX score 14, low risk.   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  She did not undergo any adjuvant treatment with letrozole or XRT at that time.  Patient subsequently had local recurrence and underwent simple mastectomy on Jun 17, 2020 with 1 lymph node positive for disease at that time.  She then underwent axillary dissection on July 29, 2020 and noted to have 2 additional lymph nodes positive for disease for a total of 3.  Although she had full mastectomy, she underwent adjuvant XRT given her positive axillary lymph nodes completing treatment on December 07, 2020.  Adjuvant chemotherapy was not necessary given her low risk Oncotype score.  Patient has now discontinued anastrozole and letrozole secondary to intolerance.  She does not wish to retry another medication and expressed understanding that this increases her risk of recurrence.  Repeat mammogram on December 07, 2022 continues to show a lesion that was reported as BI-RADS 4.  Patient once again has refused biopsy and does not feel the need to have a repeat mammogram for at least 1 more year.  No further intervention is needed.  Return to clinic in 1 year after her mammogram  for further evaluation and discussion of the results.   Bone health: Patient's most recent bone mineral density on Jun 01, 2022 revealed a T-score of -1.0.  This is decreased from 2 years prior where her T-score is -0.2.  This is still considered normal.  Patient's next bone mineral density will be due in May 2026.  These can be monitored by primary care.  Polycythemia: Chronic and unchanged.  Patient's most recent hemoglobin is 17.2.  Previously, all of her other laboratory work including hemochromatosis mutation and JAK2 mutation were either negative or within normal limits.  Patient states she plans to go to the [employer] to donate blood.  Have recommended primary care monitor CBC 1-2 times per year.  No intervention is needed.  Return to clinic as above.     Patient expressed understanding and was in agreement with this plan. She also understands that She can call clinic at any time with any questions, concerns, or complaints.    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

## 2022-12-23 ENCOUNTER — Encounter: Payer: Self-pay | Admitting: Oncology

## 2023-01-26 ENCOUNTER — Ambulatory Visit
Admission: RE | Admit: 2023-01-26 | Discharge: 2023-01-26 | Disposition: A | Discharge status: Home / Self Care | Payer: Medicare HMO | Source: Ambulatory Visit | Attending: Radiation Oncology | Admitting: Radiation Oncology

## 2023-01-26 ENCOUNTER — Encounter: Payer: Self-pay | Admitting: Radiation Oncology

## 2023-01-26 VITALS — BP 139/74 | HR 89 | Temp 97.6°F | Resp 16 | Wt 108.0 lb

## 2023-01-26 DIAGNOSIS — Z923 Personal history of irradiation: Secondary | ICD-10-CM | POA: Insufficient documentation

## 2023-01-26 DIAGNOSIS — Z170 Estrogen receptor positive status [ER+]: Secondary | ICD-10-CM | POA: Insufficient documentation

## 2023-01-26 DIAGNOSIS — C50411 Malignant neoplasm of upper-outer quadrant of right female breast: Secondary | ICD-10-CM | POA: Insufficient documentation

## 2023-01-26 NOTE — Progress Notes (Signed)
Radiation Oncology Follow up Note  Name: Sarah Hurley   Date:   01/26/2023 MRN:  [id number] DOB: 1955-08-10    This 67 y.o. female presents to the clinic today for 2-year follow-up status post whole breast radiation to her right breast for invasive lobular carcinoma stage IIb (T2 N1 M0) ER/PR positive.  REFERRING PROVIDER: Jerl Mina, MD  HPI: Patient is a 66 year old female now out over 2 years having completed whole breast radiation to her right breast for invasive lobular carcinoma stage IIb.  Seen today in routine follow-up she is doing well.  She specifically denies breast tenderness cough or bone pain.  She had mammograms back in November.  Which showed relatively stable mammographic appearance although there are indeterminate left breast calcifications not progressing and left breast there are tactic guided biopsy was recommended.  Patient has refused repeat biopsy.  She is also discontinued endocrine therapy based on numerous side effects.  COMPLICATIONS OF TREATMENT: none  FOLLOW UP COMPLIANCE: keeps appointments   PHYSICAL EXAM:  BP 139/74   Pulse 89   Temp 97.6 F (36.4 C)   Resp 16   Wt 108 lb (49 kg)   BMI 19.13 kg/m  Lungs are clear to A&P cardiac examination essentially unremarkable with regular rate and rhythm. No dominant mass or nodularity is noted in either breast in 2 positions examined. Incision is well-healed. No axillary or supraclavicular adenopathy is appreciated. Cosmetic result is excellent.  Well-developed well-nourished patient in NAD. HEENT reveals PERLA, EOMI, discs not visualized.  Oral cavity is clear. No oral mucosal lesions are identified. Neck is clear without evidence of cervical or supraclavicular adenopathy. Lungs are clear to A&P. Cardiac examination is essentially unremarkable with regular rate and rhythm without murmur rub or thrill. Abdomen is benign with no organomegaly or masses noted. Motor sensory and DTR levels are equal and symmetric  in the upper and lower extremities. Cranial nerves II through XII are grossly intact. Proprioception is intact. No peripheral adenopathy or edema is identified. No motor or sensory levels are noted. Crude visual fields are within normal range.  RADIOLOGY RESULTS: Mammograms reviewed compatible with above-stated findings  PLAN: Present time patient continues close follow-up care with both her surgeon as well as medical oncology.  It is over 2 years I have will turn follow-up care over to those providers.  I would be happy to reevaluate the patient anytime should that be indicated.  I would like to take this opportunity to thank you for allowing me to participate in the care of your patient.Carmina Miller, MD

## 2023-11-02 ENCOUNTER — Other Ambulatory Visit: Payer: Self-pay | Admitting: General Surgery

## 2023-11-02 DIAGNOSIS — Z853 Personal history of malignant neoplasm of breast: Secondary | ICD-10-CM

## 2023-11-12 LAB — COLOGUARD: COLOGUARD: NEGATIVE

## 2023-11-17 ENCOUNTER — Ambulatory Visit: Admission: RE | Admit: 2023-11-17 | Source: Home / Self Care | Admitting: Gastroenterology

## 2023-11-17 SURGERY — COLONOSCOPY
Anesthesia: General

## 2023-12-08 ENCOUNTER — Ambulatory Visit
Admission: RE | Admit: 2023-12-08 | Discharge: 2023-12-08 | Disposition: A | Discharge status: Home / Self Care | Source: Ambulatory Visit | Attending: General Surgery | Admitting: General Surgery

## 2023-12-08 DIAGNOSIS — Z853 Personal history of malignant neoplasm of breast: Secondary | ICD-10-CM | POA: Insufficient documentation

## 2023-12-25 ENCOUNTER — Ambulatory Visit: Payer: Medicare HMO | Admitting: Oncology

## 2024-01-01 ENCOUNTER — Telehealth: Payer: Self-pay | Admitting: Oncology

## 2024-01-01 NOTE — Telephone Encounter (Signed)
 Pt called to change appt date/time - said she couldn't make the appt for this week - r/s appt w/pt and confirmed new date/time/provider [facility] instead of [place]) - LH

## 2024-01-04 ENCOUNTER — Inpatient Hospital Stay: Admitting: Nurse Practitioner

## 2024-01-11 ENCOUNTER — Inpatient Hospital Stay: Admitting: Oncology

## 2024-01-18 ENCOUNTER — Encounter: Payer: Self-pay | Admitting: Oncology

## 2024-01-18 ENCOUNTER — Inpatient Hospital Stay: Attending: Oncology | Admitting: Oncology

## 2024-01-18 VITALS — BP 143/82 | HR 79 | Temp 98.6°F | Resp 20 | Ht 63.0 in | Wt 106.9 lb

## 2024-01-18 DIAGNOSIS — Z9011 Acquired absence of right breast and nipple: Secondary | ICD-10-CM | POA: Insufficient documentation

## 2024-01-18 DIAGNOSIS — Z8614 Personal history of Methicillin resistant Staphylococcus aureus infection: Secondary | ICD-10-CM | POA: Insufficient documentation

## 2024-01-18 DIAGNOSIS — C50411 Malignant neoplasm of upper-outer quadrant of right female breast: Secondary | ICD-10-CM | POA: Diagnosis not present

## 2024-01-18 DIAGNOSIS — Z8249 Family history of ischemic heart disease and other diseases of the circulatory system: Secondary | ICD-10-CM | POA: Diagnosis not present

## 2024-01-18 DIAGNOSIS — Z803 Family history of malignant neoplasm of breast: Secondary | ICD-10-CM | POA: Diagnosis not present

## 2024-01-18 DIAGNOSIS — Z170 Estrogen receptor positive status [ER+]: Secondary | ICD-10-CM | POA: Diagnosis not present

## 2024-01-18 DIAGNOSIS — Z17411 Hormone receptor positive with human epidermal growth factor receptor 2 negative status: Secondary | ICD-10-CM | POA: Insufficient documentation

## 2024-01-18 DIAGNOSIS — Z79899 Other long term (current) drug therapy: Secondary | ICD-10-CM | POA: Diagnosis not present

## 2024-01-18 DIAGNOSIS — Z1721 Progesterone receptor positive status: Secondary | ICD-10-CM | POA: Insufficient documentation

## 2024-01-18 DIAGNOSIS — D751 Secondary polycythemia: Secondary | ICD-10-CM | POA: Insufficient documentation

## 2024-01-18 NOTE — Progress Notes (Signed)
 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  Oncotype score 14, low risk.  INTERVAL HISTORY: Patient returns to clinic today for routine yearly evaluation and discussion of her mammogram results.  She continues to feel well and remains asymptomatic.  She has no neurologic complaints.  She denies any recent fevers or illnesses.  She has a good appetite and denies weight loss.  She has no chest pain, shortness of breath, cough, or hemoptysis.  She denies any nausea, vomiting, constipation, or diarrhea.  She has no urinary complaints.  Patient offers no specific complaints today.  REVIEW OF SYSTEMS:   Review of Systems  Constitutional: Negative.  Negative for fever, malaise/fatigue and weight loss.  Respiratory: Negative.  Negative for cough and hemoptysis.   Cardiovascular: Negative.  Negative for chest pain and leg swelling.  Gastrointestinal: Negative.  Negative for abdominal pain.  Genitourinary: Negative.   Musculoskeletal: Negative.  Negative for back pain.  Skin: Negative.  Negative for rash.  Neurological: Negative.  Negative for dizziness, focal weakness, weakness and headaches.  Psychiatric/Behavioral: Negative.  The patient is not nervous/anxious.     As per HPI. Otherwise, a complete review of systems is negative.  PAST MEDICAL HISTORY: Past Medical History:  Diagnosis Date   Arthritis    fingers   Breast mass, right 12/20/2017   PLEOMORPHIC LOBULAR CARCINOMA IN SITU   Cancer (HCC)    right breast ILC   Complication of anesthesia    PT STATES THAT ANESTHESIA HAS  ALWAYS USE THE ANESTHESIA THAT IS GIVEN TO HEART PATIENTS   Fluttering heart    GERD (gastroesophageal reflux disease)    Heart murmur    History of methicillin resistant staphylococcus aureus (MRSA)    PONV (postoperative nausea and vomiting)    patient denies PONV 06/08/20   Seasonal allergies    Thyroid  nodule     PAST SURGICAL HISTORY: Past Surgical History:  Procedure Laterality Date   AXILLARY LYMPH NODE DISSECTION Right 07/29/2020   Procedure: AXILLARY LYMPH NODE DISSECTION;  Surgeon: Dessa Reyes LELON, MD;  Location: [facility];  Service: General;  Laterality: Right;  (no SLN)   BREAST BIOPSY Right 12/20/2017   stereo bx calcs/x clip/ PLEOMORPHIC LOBULAR CARCINOMA IN SITU   BREAST BIOPSY Right 04/09/2020   u/s bx path pending 10:00 6cmfn Q clip   BREAST CYST ASPIRATION Bilateral 2003   neg   BREAST LUMPECTOMY Right 03/26/2018   LCIS   BREAST LUMPECTOMY WITH NEEDLE LOCALIZATION Right 03/26/2018   Procedure: RIGHT BREAST WIDE EXCISION WITH NEEDLE LOCALIZATION;  Surgeon: Dessa Reyes LELON, MD;  Location: [facility];  Service: General;  Laterality: Right;   BREAST RECONSTRUCTION WITH PLACEMENT OF TISSUE EXPANDER AND FLEX HD (ACELLULAR HYDRATED DERMIS) Right 06/17/2020   Procedure: IMMEDIATE RIGHT BREAST RECONSTRUCTION WITH PLACEMENT OF TISSUE EXPANDER AND FLEX HD (ACELLULAR HYDRATED DERMIS);  Surgeon: Lowery Estefana RAMAN, DO;  Location: [facility];  Service: Plastics;  Laterality: Right;   COLONOSCOPY  2015   EYE SURGERY     BIL   mastecomy Right 06/17/2020   mastecomy with implant with radiation.   MASTOPEXY Left 09/30/2020   Procedure: LEFT  BREAST MASTOPEXY;  Surgeon: Lowery Estefana RAMAN, DO;  Location: [facility];  Service: Plastics;  Laterality: Left;   REMOVAL OF TISSUE EXPANDER AND PLACEMENT OF IMPLANT Right 09/30/2020   Procedure: REMOVAL OF TISSUE EXPANDER AND PLACEMENT OF IMPLANT RIGHT BREAST;  Surgeon: Lowery Estefana RAMAN, DO;  Location: [facility];  Service: Plastics;  Laterality: Right;  2 hours   SIMPLE MASTECTOMY WITH AXILLARY SENTINEL NODE BIOPSY Right 06/17/2020   Procedure: SIMPLE MASTECTOMY WITH AXILLARY SENTINEL NODE BIOPSY;  Surgeon: Dessa Reyes ORN, MD;  Location: [facility];  Service: General;  Laterality: Right;   TONSILLECTOMY      FAMILY HISTORY: Family History  Problem Relation Age of Onset   Hypertension Mother    Heart disease Father    Breast cancer Sister     ADVANCED DIRECTIVES (Y/N):  N  HEALTH MAINTENANCE: Social History   Tobacco Use   Smoking status: Never   Smokeless tobacco: Never  Vaping Use   Vaping status: Never Used  Substance Use Topics   Alcohol use: Yes    Alcohol/week: 14.0 standard drinks of alcohol    Types: 14 Shots of liquor per week    Comment: couple of drinks/day   Drug use: No     Colonoscopy:  PAP:  Bone density:  Lipid panel:  Allergies  Allergen Reactions   Pseudoephedrine Other (See Comments)    palpitations    Current Outpatient Medications  Medication Sig Dispense Refill   magnesium 30 MG tablet Take 30 mg by mouth daily.     Magnesium Gluconate 550 MG TABS Take 30 mg by mouth.     Olopatadine HCl (PATADAY OP) Place 1 drop into both eyes daily.     OVER THE COUNTER MEDICATION Take 1 tablet by mouth daily. Chelated zinc with copper     PAPAYA ENZYME PO Take 1 tablet by mouth daily as needed (indigestion).     triamcinolone (NASACORT) 55 MCG/ACT AERO nasal inhaler Place 1 spray into the nose daily.     XIIDRA 5 % SOLN      No current facility-administered medications for this visit.    OBJECTIVE: Vitals:   01/18/24 1455  BP: (!) 143/82  Pulse: 79  Resp: 20  Temp: 98.6 F (37 C)  SpO2: 100%       Body mass index is 18.94 kg/m.    ECOG FS:0 - Asymptomatic  General: Well-developed, well-nourished, no acute distress. Eyes: Pink conjunctiva, anicteric sclera. HEENT: Normocephalic, moist mucous membranes. Lungs: No audible wheezing  or coughing. Heart: Regular rate and rhythm. Abdomen: Soft, nontender, no obvious distention. Musculoskeletal: No edema, cyanosis, or clubbing. Neuro: Alert, answering all questions appropriately. Cranial nerves grossly intact. Skin: No rashes or petechiae noted. Psych: Normal affect.  LAB RESULTS:  Lab Results  Component Value Date   NA 140 06/04/2015   K 3.4 (L) 06/04/2015   CL 102 06/04/2015   CO2 25 06/04/2015   GLUCOSE 105 (H) 06/04/2015   BUN 18 06/04/2015   CREATININE 0.93 06/04/2015   CALCIUM 10.2 06/04/2015   PROT 7.7 06/04/2015   ALBUMIN 5.0 06/04/2015   AST 30 06/04/2015   ALT 18 06/04/2015   ALKPHOS 49 06/04/2015   BILITOT 2.3 (H) 06/04/2015   GFRNONAA >60 06/04/2015   GFRAA >60 06/04/2015    Lab Results  Component Value Date   WBC 5.3 12/22/2022   NEUTROABS 3.0 12/22/2022   HGB 17.2 (H) 12/22/2022   HCT 51.3 (H) 12/22/2022  MCV 94.5 12/22/2022   PLT 196 12/22/2022   Lab Results  Component Value Date   IRON 97 12/22/2022   TIBC 375 12/22/2022   IRONPCTSAT 26 12/22/2022   Lab Results  Component Value Date   FERRITIN 57 12/22/2022     STUDIES: No results found.   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  Oncotype DX score 14, low risk.   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  She did not undergo any adjuvant treatment with letrozole  or XRT at that time.  Patient subsequently had local recurrence and underwent simple mastectomy on Jun 17, 2020 with 1 lymph node positive for disease at that time.  She then underwent axillary dissection on July 29, 2020 and noted to have 2 additional lymph nodes positive for disease for a total of 3.  Although she had full mastectomy, she underwent adjuvant XRT given her positive axillary lymph nodes  completing treatment on December 07, 2020.  Adjuvant chemotherapy was not necessary given her low risk Oncotype score.  Patient has now discontinued anastrozole  and letrozole  secondary to intolerance.  She does not wish to retry another medication and expressed understanding that this increases her risk of recurrence.  Mammogram on December 08, 2023 was reported as BI-RADS 2.  No biopsy is necessary.  After discussion with the patient, is agreed upon that no further follow-up is necessary.  Please refer patient back if there are any questions or concerns.  Bone health: Patient's most recent bone mineral density on Jun 01, 2022 revealed a T-score of -1.0.  This is decreased from 2 years prior where her T-score is -0.2.  This is still considered normal.  Patient's next bone mineral density will be due in May 2026.  These can be monitored by primary care.  Polycythemia: Chronic and unchanged.  Patient's most recent hemoglobin is 17.2.  Previously, all of her other laboratory work including hemochromatosis mutation and JAK2 mutation were either negative or within normal limits.  Patient states she plans to go to the [employer] to donate blood.  Have recommended primary care monitor CBC 1-2 times per year.  No intervention is needed.  No follow-up has been scheduled as above.  I spent a total of 20 minutes reviewing chart data, face-to-face evaluation with the patient, counseling and coordination of care as detailed above.   Patient expressed understanding and was in agreement with this plan. She also understands that She can call clinic at any time with any questions, concerns, or complaints.    Cancer Staging  Carcinoma of upper-outer quadrant of female breast, right 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

## 2024-01-29 ENCOUNTER — Ambulatory Visit: Payer: Medicare HMO | Admitting: Radiation Oncology
